# Patient Record
Sex: Female | Born: 1955 | Race: White | Hispanic: No | Marital: Married | Smoking: Never smoker
Health system: Southern US, Community
[De-identification: ages and names within clinical notes are randomized; demographics above are authoritative.]

## PROBLEM LIST (undated history)

## (undated) ENCOUNTER — Ambulatory Visit: Discharge status: Absent without leave | Payer: Federal, State, Local not specified - PPO

## (undated) DIAGNOSIS — A389 Scarlet fever, uncomplicated: Secondary | ICD-10-CM

## (undated) DIAGNOSIS — I10 Essential (primary) hypertension: Secondary | ICD-10-CM

## (undated) DIAGNOSIS — E7800 Pure hypercholesterolemia, unspecified: Secondary | ICD-10-CM

## (undated) DIAGNOSIS — E119 Type 2 diabetes mellitus without complications: Secondary | ICD-10-CM

## (undated) DIAGNOSIS — Z8619 Personal history of other infectious and parasitic diseases: Secondary | ICD-10-CM

## (undated) HISTORY — DX: Pure hypercholesterolemia, unspecified: E78.00

## (undated) HISTORY — PX: EYE SURGERY: SHX253

## (undated) HISTORY — PX: KNEE ARTHROSCOPY: SUR90

## (undated) HISTORY — PX: SHOULDER SURGERY: SHX246

## (undated) HISTORY — PX: ELBOW BURSA SURGERY: SHX615

## (undated) HISTORY — PX: TONSILLECTOMY: SUR1361

## (undated) HISTORY — PX: BUNIONECTOMY: SHX129

## (undated) HISTORY — PX: CERVICAL DISC SURGERY: SHX588

---

## 1998-08-20 HISTORY — PX: ABDOMINAL HYSTERECTOMY: SHX81

## 1998-10-13 ENCOUNTER — Encounter: Payer: Self-pay | Admitting: Neurosurgery

## 1998-10-13 ENCOUNTER — Inpatient Hospital Stay (HOSPITAL_COMMUNITY): Admission: RE | Admit: 1998-10-13 | Discharge: 1998-10-14 | Payer: Self-pay | Admitting: Neurosurgery

## 1998-10-14 ENCOUNTER — Encounter: Payer: Self-pay | Admitting: Neurosurgery

## 2005-12-07 ENCOUNTER — Ambulatory Visit: Payer: Self-pay | Admitting: *Deleted

## 2006-10-07 ENCOUNTER — Ambulatory Visit: Payer: Self-pay

## 2006-12-20 ENCOUNTER — Ambulatory Visit: Payer: Self-pay | Admitting: Gastroenterology

## 2007-01-02 ENCOUNTER — Ambulatory Visit: Payer: Self-pay

## 2008-01-15 ENCOUNTER — Ambulatory Visit: Payer: Self-pay

## 2009-01-25 ENCOUNTER — Ambulatory Visit: Payer: Self-pay | Admitting: Family Medicine

## 2009-04-24 ENCOUNTER — Ambulatory Visit: Payer: Self-pay | Admitting: Internal Medicine

## 2014-02-03 ENCOUNTER — Ambulatory Visit: Payer: Self-pay | Admitting: Specialist

## 2014-04-05 ENCOUNTER — Ambulatory Visit: Payer: Self-pay | Admitting: Anesthesiology

## 2014-04-13 ENCOUNTER — Ambulatory Visit: Payer: Self-pay | Admitting: Orthopedic Surgery

## 2014-08-20 HISTORY — PX: CATARACT EXTRACTION: SUR2

## 2014-12-11 NOTE — Op Note (Signed)
PATIENT NAME:  Pamela Baldwin, Pamela Baldwin MR#:  [id number] DATE OF BIRTH:  10-16-1955  DATE OF PROCEDURE:  04/13/2014  PREOPERATIVE DIAGNOSES: Left shoulder rotator cuff tear, subacromial impingement, acromioclavicular joint arthrosis and a possible biceps tendinosis versus tear.   POSTOPERATIVE DIAGNOSES: Left shoulder enlarged rotator cuff tear involving the supraspinatus and infraspinatus retracted to the glenoid, superior labral anterior posterior tear (SLAP tear) with biceps tendinosis, acromioclavicular joint arthrosis  and subacromial impingement.   PROCEDURE: Left shoulder arthroscopic subacromial decompression and distal clavicle excision with mini open biceps tenodesis and rotator cuff repair.   SURGEON: Thornton Park, MD   ANESTHESIA: General.   ESTIMATED BLOOD LOSS: Minimal.   COMPLICATIONS: None.   IMPLANTS: ArthroCare Magnum 2 anchors x 4.   INDICATIONS FOR PROCEDURE: The patient is a 59 year old female who has had persistent left shoulder pain which has not responded to nonsteroidal anti-inflammatories, corticosteroid injection and therapy. The patient has had a sharp increase in pain beginning in July. Her initial MRI showed a tear involving the supraspinatus with slight retraction. Given the fact that she has not responded to nonoperative management, the patient wished to proceed with surgical fixation. I had reviewed the risks and benefits of surgery with her in my office prior to the day of surgery. She understood the risks of surgery included infection, bleeding, nerve or blood vessel injury which may lead to permanent left upper extremity weakness or numbness, persistent pain, re- tear of the rotator cuff, hardware failure and the need for further surgery. Medical risks include, but are not limited to DVT and pulmonary embolism, myocardial infarction, stroke, pneumonia, respiratory failure and death. The patient understood these risks and wished to proceed.   PROCEDURE NOTE: The  patient was met in the preoperative area. Her daughter was by the bedside. I answered all of their questions and explained what to expect in their immediate postoperative course. I updated the patient's history and physical and signed the left shoulder according to the hospital's right site protocol. The patient was then brought to the Operating Room. She underwent general anesthesia with an LMA. She was then positioned in a beach chair position. She had previously received an interscalene block in the preoperative holding area by the anesthesia service as well. All bony prominences were adequately padded. The patient was prepped and draped in a sterile fashion. A timeout was performed to verify the patient's name, date of birth, medical record number, correct site of surgery and correct procedure to be performed. It was also used to verify the patient had received antibiotics, and all proper instruments, implants and radiographic studies were available in the room. Once all in attendance were in agreement, the case began.   A spider arm positioner was used for this case. The patient had all bony landmarks drawn out with a surgical marker along with proposed arthroscopy incisions. These were pre-injected with 1% lidocaine plain. An 11 blade was used to establish a posterior portal through which the arthroscope was placed into the glenohumeral joint. An 18-gauge spinal needle was then used to provide localization for the anterior portal. This was also created with a #11 blade. A 5.75 mm arthroscopic cannula was placed through the anterior portal. The findings on arthroscopy included a large retracted tear involving the supraspinatus and infraspinatus to the level of the glenohumeral joint. The patient had a large SLAP tear with biceps tenodesis. The patient had mild fraying of glenoid and humeral head surfaces. There were no chondral loose bodies or HAGL lesions in  the inferior recess. The supraspinatus was intact.  The posterior and anterior-inferior labrum were also intact and without tear. An 18-gauge spinal needle was used to create a lateral portal as well. Through this, an Therapist, nutritional was placed into the rotator cuff as a traction suture. The decision was made, given the large SLAP tear and the biceps tendinosis, to perform a biceps tenodesis. Through the lateral portal, a Smart stitch was also placed into the biceps tendon. Then, a 90 degree ArthroCare wand was used to perform a biceps tenotomy arthroscopically. The  4-0 resector shaver blade was then used to debride the superior labrum. The arthroscope was then placed into the subacromial space. Through the lateral portal, a 5.5 mm resector shaver blade was used to perform a subacromial decompression. It was then placed through the anterior portal and a distal clavicle excision was also performed. Two additional Smart stitches were placed in the lateral edge of the rotator cuff. All bony debris was removed from the subacromial space. An arthroscopic elevator was used to mobilize the rotator cuff tear with the assistance of the Smart stitches as traction sutures. Final arthroscopic images were taken. All arthroscopic instruments were then removed. A saber-type incision was made along the lateral border of the acromion approximately 2 to 2.5 cm in length. The subcutaneous tissue was dissected with the Metzenbaum scissor and pick-up. The deltoid fascia was then identified and split and the deltoid muscle split in line with its fibers. The Smart stitches were removed through the deltoid split incision. A shoulder self-retaining retractor was placed and the deltoid split to allow for visualization of the rotator cuff. The attention was turned to performing the biceps tenodesis. A single ArthroCare Magnum anchor was drilled and then placed at the superior aspect of the intertuberous groove. The Smart stitch that was earlier placed in the biceps was then placed  into the Magnum 2 anchor and it was tensioned to allow for approximation of the biceps to the Magnum 2 anchor at the top of the intertuberous groove. The intra-articular portion of the biceps had been resected using Metzenbaum scissor. Once the biceps tenodesis was completed, the attention was turned to the rotator cuff repair. The rotator cuff could be reduced to the greater tuberosity. A 5.5 mm resector shaver blade was used to gently burr the surface of the greater tuberosity to remove all remaining torn fibers of the rotator cuff. Punctate bleeding was identified which will assist healing of the rotator cuff. Three Magnum 2 anchors were then placed along the lateral border of the greater tuberosity. Each Smart stitch was placed in a separate anchor and tensioned to allow for anatomic reduction of the rotator cuff onto the greater tuberosity. Once all 3 anchors were placed, the shoulder was taken through a gentle range of motion. There were no gaps or dog ears in the repair. The rotator cuff was well approximated to the greater tuberosity. External images of the repair were taken through the deltoid split incision with the arthroscope and then the arthroscope was placed back through the posterior portal and internal photos of the repair were taken. The joint was copiously irrigated. All arthroscopic instruments were removed. The self-retaining shoulder retractor was also removed. The deltoid fascia was closed with interrupted 0 Vicryl, subcutaneous tissue closed with 2-0 Vicryl and the saber incision was closed with a running 4-0 undyed Monocryl. Three arthroscopy portal incisions were closed with 4-0 Monocryl after a single 2-0 Vicryl suture was placed in the subcutaneous tissue.  A dry sterile dressing was applied along with TENS unit leads, a Polar Care sleeve and an abduction sling. The patient was awakened and transferred to a hospital stretcher where she was brought to the PACU in stable condition. I was  scrubbed and present for the entire case, and all sharp and instrument counts were correct at the conclusion of the case. I spoke with the patient's daughter in the postoperative consultation room to let her know the case had gone without complication and the patient was stable in the recovery room.    ____________________________ Timoteo Gaul, MD klk:TT D: 04/19/2014 20:12:05 ET T: 04/19/2014 21:49:00 ET JOB#: [id number]  cc: Timoteo Gaul, MD, <Dictator> Timoteo Gaul MD ELECTRONICALLY SIGNED 04/25/2014 13:49

## 2015-06-21 ENCOUNTER — Other Ambulatory Visit: Payer: Self-pay | Admitting: Otolaryngology

## 2015-06-21 DIAGNOSIS — R42 Dizziness and giddiness: Secondary | ICD-10-CM

## 2015-06-21 DIAGNOSIS — E041 Nontoxic single thyroid nodule: Secondary | ICD-10-CM

## 2015-06-21 DIAGNOSIS — H903 Sensorineural hearing loss, bilateral: Secondary | ICD-10-CM

## 2015-06-21 DIAGNOSIS — H905 Unspecified sensorineural hearing loss: Secondary | ICD-10-CM

## 2015-07-06 ENCOUNTER — Ambulatory Visit
Admission: RE | Admit: 2015-07-06 | Discharge: 2015-07-06 | Disposition: A | Discharge status: Home / Self Care | Payer: Federal, State, Local not specified - PPO | Source: Ambulatory Visit | Attending: Otolaryngology | Admitting: Otolaryngology

## 2015-07-06 ENCOUNTER — Ambulatory Visit: Admission: RE | Admit: 2015-07-06 | Payer: Federal, State, Local not specified - PPO | Source: Ambulatory Visit

## 2015-07-06 DIAGNOSIS — R42 Dizziness and giddiness: Secondary | ICD-10-CM | POA: Diagnosis present

## 2015-07-06 DIAGNOSIS — H903 Sensorineural hearing loss, bilateral: Secondary | ICD-10-CM

## 2015-07-06 DIAGNOSIS — H905 Unspecified sensorineural hearing loss: Secondary | ICD-10-CM

## 2015-07-06 DIAGNOSIS — E041 Nontoxic single thyroid nodule: Secondary | ICD-10-CM | POA: Insufficient documentation

## 2015-07-06 MED ORDER — GADOBENATE DIMEGLUMINE 529 MG/ML IV SOLN
18.0000 mL | Freq: Once | INTRAVENOUS | Status: AC | PRN
  Administered 2015-07-06: 18 mL via INTRAVENOUS

## 2015-08-02 ENCOUNTER — Other Ambulatory Visit: Payer: Self-pay | Admitting: Otolaryngology

## 2015-08-02 DIAGNOSIS — E041 Nontoxic single thyroid nodule: Secondary | ICD-10-CM

## 2016-04-16 ENCOUNTER — Ambulatory Visit (INDEPENDENT_AMBULATORY_CARE_PROVIDER_SITE_OTHER): Payer: Federal, State, Local not specified - PPO | Admitting: Podiatry

## 2016-04-16 ENCOUNTER — Ambulatory Visit (INDEPENDENT_AMBULATORY_CARE_PROVIDER_SITE_OTHER): Payer: Federal, State, Local not specified - PPO

## 2016-04-16 ENCOUNTER — Encounter: Payer: Self-pay | Admitting: Podiatry

## 2016-04-16 DIAGNOSIS — M79671 Pain in right foot: Secondary | ICD-10-CM | POA: Diagnosis not present

## 2016-04-16 DIAGNOSIS — M6740 Ganglion, unspecified site: Secondary | ICD-10-CM | POA: Diagnosis not present

## 2016-04-16 DIAGNOSIS — A389 Scarlet fever, uncomplicated: Secondary | ICD-10-CM | POA: Insufficient documentation

## 2016-04-16 DIAGNOSIS — Z8619 Personal history of other infectious and parasitic diseases: Secondary | ICD-10-CM | POA: Insufficient documentation

## 2016-04-16 DIAGNOSIS — I10 Essential (primary) hypertension: Secondary | ICD-10-CM | POA: Insufficient documentation

## 2016-04-16 MED ORDER — DICLOFENAC SODIUM 1 % TD GEL
4.0000 g | Freq: Four times a day (QID) | TRANSDERMAL | 2 refills | Status: DC
Start: 2016-04-16 — End: 2017-03-12

## 2016-04-16 NOTE — Progress Notes (Signed)
   Subjective:    Patient ID: Pamela Baldwin, female    DOB: [birth year], 60 y.o.   MRN: [id number]  HPI: She presents today with chief complaint of a soft nodule to the dorsal aspect of the right foot. She states this been there for approximately 6 weeks and she says this seems to be swelling but there is no pain associated with it and no pressure and thus she has not treated it. She denies any trauma to the foot.  Review of Systems  Musculoskeletal: Positive for arthralgias.  Skin: Positive for rash.  All other systems reviewed and are negative.      Objective:   Physical Exam: Vital signs are stable she is alert and oriented 3 pulses are palpable. Neurologic sensorium is intact deep tendon reflexes are intact muscle strength is 5 over 5 dorsiflexion plantar flexors and inverters everters on physical musculatures intact. Orthopedic evaluation was resolved with distal ankle for range of motion crepitation. She does have a nonpulsatile mass dorsal aspect of the right foot which appears to be bony in nature overlying the first metatarsal medial cuneiform. This appears to be osteoarthritic changes on radiograph reviews of the right foot taken today. She also has a soft tissue mass to the dorsal aspect of the same foot overlying this area consistent with a ganglion cyst. This is nonpulsatile in nature does appear to be fluctuant much like a cyst.      Assessment & Plan:  Diabetes mellitus with ganglion cyst and osteoarthritis dorsal aspect right foot.  Plan: Started her on diclofenac gel discussed the possibility of aspiration which she declined. We also discussed the possible need for surgical intervention she declined. Follow up with her as needed.

## 2016-06-20 ENCOUNTER — Ambulatory Visit
Admission: RE | Admit: 2016-06-20 | Discharge: 2016-06-20 | Disposition: A | Discharge status: Home / Self Care | Payer: Federal, State, Local not specified - PPO | Source: Ambulatory Visit | Attending: Otolaryngology | Admitting: Otolaryngology

## 2016-06-20 DIAGNOSIS — E041 Nontoxic single thyroid nodule: Secondary | ICD-10-CM | POA: Insufficient documentation

## 2017-03-12 ENCOUNTER — Other Ambulatory Visit: Payer: Self-pay | Admitting: Family Medicine

## 2017-03-12 ENCOUNTER — Encounter: Payer: Self-pay | Admitting: Obstetrics and Gynecology

## 2017-03-12 ENCOUNTER — Ambulatory Visit (INDEPENDENT_AMBULATORY_CARE_PROVIDER_SITE_OTHER): Payer: Federal, State, Local not specified - PPO | Admitting: Obstetrics and Gynecology

## 2017-03-12 ENCOUNTER — Other Ambulatory Visit: Payer: Self-pay | Admitting: Obstetrics and Gynecology

## 2017-03-12 DIAGNOSIS — Z1231 Encounter for screening mammogram for malignant neoplasm of breast: Secondary | ICD-10-CM

## 2017-03-12 DIAGNOSIS — Z01419 Encounter for gynecological examination (general) (routine) without abnormal findings: Secondary | ICD-10-CM

## 2017-03-12 DIAGNOSIS — Z1239 Encounter for other screening for malignant neoplasm of breast: Secondary | ICD-10-CM

## 2017-03-12 NOTE — Progress Notes (Signed)
Patient ID: Pamela Baldwin, female   DOB: September 18, 1955, 61 y.o.   MRN: [id number]     Gynecology Annual Exam  PCP: [facility]  Chief Complaint:  Chief Complaint  Patient presents with  . Gynecologic Exam    History of Present Illness:Patient is a 61 y.o. [id number] presents for annual exam. The patient has no complaints today.   LMP: No LMP recorded. Patient has had a hysterectomy.   The patient is sexually active. She denies dyspareunia.  The patient does perform self breast exams.  There is no notable family history of breast or ovarian cancer in her family.  The patient wears seatbelts: yes.   The patient has regular exercise: not asked.    The patient denies current symptoms of depression.     Review of Systems: Review of Systems  Constitutional: Negative for chills and fever.  HENT: Negative for congestion.   Respiratory: Negative for cough and shortness of breath.   Cardiovascular: Negative for chest pain and palpitations.  Gastrointestinal: Negative for abdominal pain, constipation, diarrhea, heartburn, nausea and vomiting.  Genitourinary: Negative for dysuria, frequency and urgency.  Skin: Negative for itching and rash.  Neurological: Negative for dizziness and headaches.  Endo/Heme/Allergies: Negative for polydipsia.  Psychiatric/Behavioral: Negative for depression.    Past Medical History:  History reviewed. No pertinent past medical history.  Past Surgical History:  Past Surgical History:  Procedure Laterality Date  . ABDOMINAL HYSTERECTOMY  2000  . CATARACT EXTRACTION  2016  . SHOULDER SURGERY      Gynecologic History:  No LMP recorded. Patient has had a hysterectomy.  Obstetric History: [id number]  Family History:  Family History  Problem Relation Age of Onset  . Colon cancer Brother 51  . Liver cancer Maternal Uncle 5    Social History:  Social History   Social History  . Marital status: Married    Spouse name: N/A  . Number  of children: N/A  . Years of education: N/A   Occupational History  . Not on file.   Social History Main Topics  . Smoking status: Never Smoker  . Smokeless tobacco: Never Used  . Alcohol use Yes  . Drug use: No  . Sexual activity: Yes    Birth control/ protection: None   Other Topics Concern  . Not on file   Social History Narrative  . No narrative on file    Allergies:  Allergies  Allergen Reactions  . Morphine And Related   . Penicillins     Medications: Prior to Admission medications   Medication Sig Start Date End Date Taking? Authorizing Provider  atorvastatin (LIPITOR) 40 MG tablet  03/07/17  Yes [provider]  baclofen (LIORESAL) 10 MG tablet  12/05/16  Yes [provider]  diclofenac (VOLTAREN) 75 MG EC tablet  12/05/16  Yes [provider]  lisinopril (PRINIVIL,ZESTRIL) 10 MG tablet Take 10 mg by mouth daily.   Yes [provider]  metFORMIN (GLUCOPHAGE) 500 MG tablet Take by mouth 2 (two) times daily with a meal.   Yes [provider]    Physical Exam Vitals: Blood pressure 116/64, pulse 93, height 5\' 4"  (1.626 m), weight 167 lb (75.8 kg).  General: NAD HEENT: normocephalic, anicteric Thyroid: no enlargement, no palpable nodules Pulmonary: No increased work of breathing, CTAB Cardiovascular: RRR, distal pulses 2+ Breast: Breast symmetrical, no tenderness, no palpable nodules or masses, no skin or nipple retraction present, no nipple discharge.  No axillary or supraclavicular lymphadenopathy.  Abdomen: NABS, soft, non-tender, non-distended.  Umbilicus without lesions.  No hepatomegaly, splenomegaly or masses palpable. No evidence of hernia  Genitourinary:  External: Normal external female genitalia.  Normal urethral meatus, normal  Bartholin's and Skene's glands.    Vagina: Normal vaginal mucosa, no evidence of prolapse.    Cervix: surgically absent  Uterus: surgically absent  Adnexa: ovaries non-enlarged, no  adnexal masses  Rectal: deferred  Lymphatic: no evidence of inguinal lymphadenopathy Extremities: no edema, erythema, or tenderness Neurologic: Grossly intact Psychiatric: mood appropriate, affect full  Female chaperone present for pelvic and breast  portions of the physical exam    Assessment: 61 y.o. [id number] annual exam Plan: Problem List Items Addressed This Visit    None      1) Mammogram - recommend yearly screening mammogram.  Mammogram Was ordered today  2) STI screening was not offered  3) ASCCP guidelines and rational discussed.  Patient opts for discontinue screening  4) Osteoporosis  - per USPTF routine screening DEXA at age 75 - FRAX 58 year major fracture risk 7.5%,  10 year hip fracture risk 0.7%  5) Routine healthcare maintenance including cholesterol, diabetes screening discussed managed by PCP  6) Colonoscopy .  Screening recommended starting at age 35 for average risk individuals, age 87 for individuals deemed at increased risk (including African Americans) and recommended to continue until age 33.  For patient age 34-85 individualized approach is recommended.  Gold standard screening is via colonoscopy, Cologuard screening is an acceptable alternative for patient unwilling or unable to undergo colonoscopy.  "Colorectal cancer screening for average?risk adults: 2018 guideline update from the American Cancer Society"CA: A Cancer Journal for Clinicians: Jan 16, 2017  - scheduled for September 2018 (5 year interval)  7) Follow up 1 year for routine annual

## 2017-03-12 NOTE — Patient Instructions (Signed)
Preventive Care 40-64 Years, Female Preventive care refers to lifestyle choices and visits with your health care provider that can promote health and wellness. What does preventive care include?  A yearly physical exam. This is also called an annual well check.  Dental exams once or twice a year.  Routine eye exams. Ask your health care provider how often you should have your eyes checked.  Personal lifestyle choices, including: ? Daily care of your teeth and gums. ? Regular physical activity. ? Eating a healthy diet. ? Avoiding tobacco and drug use. ? Limiting alcohol use. ? Practicing safe sex. ? Taking low-dose aspirin daily starting at age 58. ? Taking vitamin and mineral supplements as recommended by your health care provider. What happens during an annual well check? The services and screenings done by your health care provider during your annual well check will depend on your age, overall health, lifestyle risk factors, and family history of disease. Counseling Your health care provider may ask you questions about your:  Alcohol use.  Tobacco use.  Drug use.  Emotional well-being.  Home and relationship well-being.  Sexual activity.  Eating habits.  Work and work Statistician.  Method of birth control.  Menstrual cycle.  Pregnancy history.  Screening You may have the following tests or measurements:  Height, weight, and BMI.  Blood pressure.  Lipid and cholesterol levels. These may be checked every 5 years, or more frequently if you are over 81 years old.  Skin check.  Lung cancer screening. You may have this screening every year starting at age 78 if you have a 30-pack-year history of smoking and currently smoke or have quit within the past 15 years.  Fecal occult blood test (FOBT) of the stool. You may have this test every year starting at age 65.  Flexible sigmoidoscopy or colonoscopy. You may have a sigmoidoscopy every 5 years or a colonoscopy  every 10 years starting at age 30.  Hepatitis C blood test.  Hepatitis B blood test.  Sexually transmitted disease (STD) testing.  Diabetes screening. This is done by checking your blood sugar (glucose) after you have not eaten for a while (fasting). You may have this done every 1-3 years.  Mammogram. This may be done every 1-2 years. Talk to your health care provider about when you should start having regular mammograms. This may depend on whether you have a family history of breast cancer.  BRCA-related cancer screening. This may be done if you have a family history of breast, ovarian, tubal, or peritoneal cancers.  Pelvic exam and Pap test. This may be done every 3 years starting at age 80. Starting at age 36, this may be done every 5 years if you have a Pap test in combination with an HPV test.  Bone density scan. This is done to screen for osteoporosis. You may have this scan if you are at high risk for osteoporosis.  Discuss your test results, treatment options, and if necessary, the need for more tests with your health care provider. Vaccines Your health care provider may recommend certain vaccines, such as:  Influenza vaccine. This is recommended every year.  Tetanus, diphtheria, and acellular pertussis (Tdap, Td) vaccine. You may need a Td booster every 10 years.  Varicella vaccine. You may need this if you have not been vaccinated.  Zoster vaccine. You may need this after age 5.  Measles, mumps, and rubella (MMR) vaccine. You may need at least one dose of MMR if you were born in  1957 or later. You may also need a second dose.  Pneumococcal 13-valent conjugate (PCV13) vaccine. You may need this if you have certain conditions and were not previously vaccinated.  Pneumococcal polysaccharide (PPSV23) vaccine. You may need one or two doses if you smoke cigarettes or if you have certain conditions.  Meningococcal vaccine. You may need this if you have certain  conditions.  Hepatitis A vaccine. You may need this if you have certain conditions or if you travel or work in places where you may be exposed to hepatitis A.  Hepatitis B vaccine. You may need this if you have certain conditions or if you travel or work in places where you may be exposed to hepatitis B.  Haemophilus influenzae type b (Hib) vaccine. You may need this if you have certain conditions.  Talk to your health care provider about which screenings and vaccines you need and how often you need them. This information is not intended to replace advice given to you by your health care provider. Make sure you discuss any questions you have with your health care provider. Document Released: 09/02/2015 Document Revised: 04/25/2016 Document Reviewed: 06/07/2015 Elsevier Interactive Patient Education  2017 [employer].

## 2017-03-19 ENCOUNTER — Ambulatory Visit
Admission: RE | Admit: 2017-03-19 | Discharge: 2017-03-19 | Disposition: A | Discharge status: Home / Self Care | Payer: Federal, State, Local not specified - PPO | Source: Ambulatory Visit | Attending: Obstetrics and Gynecology | Admitting: Obstetrics and Gynecology

## 2017-03-19 DIAGNOSIS — Z1231 Encounter for screening mammogram for malignant neoplasm of breast: Secondary | ICD-10-CM | POA: Insufficient documentation

## 2017-03-28 ENCOUNTER — Inpatient Hospital Stay
Admission: RE | Admit: 2017-03-28 | Discharge: 2017-03-28 | Disposition: A | Discharge status: Home / Self Care | Payer: Self-pay | Source: Ambulatory Visit | Attending: *Deleted | Admitting: *Deleted

## 2017-03-28 ENCOUNTER — Encounter: Payer: Self-pay | Admitting: Obstetrics and Gynecology

## 2017-03-28 ENCOUNTER — Other Ambulatory Visit: Payer: Self-pay | Admitting: *Deleted

## 2017-03-28 DIAGNOSIS — Z9289 Personal history of other medical treatment: Secondary | ICD-10-CM

## 2018-05-26 ENCOUNTER — Encounter (INDEPENDENT_AMBULATORY_CARE_PROVIDER_SITE_OTHER): Payer: Federal, State, Local not specified - PPO | Admitting: Vascular Surgery

## 2018-06-23 ENCOUNTER — Encounter (INDEPENDENT_AMBULATORY_CARE_PROVIDER_SITE_OTHER): Payer: Self-pay | Admitting: Vascular Surgery

## 2018-06-23 ENCOUNTER — Ambulatory Visit (INDEPENDENT_AMBULATORY_CARE_PROVIDER_SITE_OTHER): Payer: Federal, State, Local not specified - PPO | Admitting: Vascular Surgery

## 2018-06-23 VITALS — BP 120/72 | HR 96 | Resp 16 | Ht 64.0 in | Wt 167.0 lb

## 2018-06-23 DIAGNOSIS — E1169 Type 2 diabetes mellitus with other specified complication: Secondary | ICD-10-CM | POA: Diagnosis not present

## 2018-06-23 DIAGNOSIS — R600 Localized edema: Secondary | ICD-10-CM

## 2018-06-23 DIAGNOSIS — E785 Hyperlipidemia, unspecified: Secondary | ICD-10-CM

## 2018-06-23 DIAGNOSIS — I83813 Varicose veins of bilateral lower extremities with pain: Secondary | ICD-10-CM | POA: Diagnosis not present

## 2018-06-23 DIAGNOSIS — E119 Type 2 diabetes mellitus without complications: Secondary | ICD-10-CM | POA: Insufficient documentation

## 2018-06-23 DIAGNOSIS — I159 Secondary hypertension, unspecified: Secondary | ICD-10-CM

## 2018-06-23 NOTE — Progress Notes (Addendum)
Subjective:    Patient ID: Pamela Baldwin, female    DOB: [birth year], 62 y.o.   MRN: [id number] Chief Complaint  Patient presents with  . New Patient (Initial Visit)    Varicose Veins   Presents as a new patient self-referred for evaluation of painful varicose veins.  Patient endorses long-standing history of varicosities located bilateral legs.  Over the last few years, the patient notes an increase in number, size and discomfort to these varicosities.  The patient also experiences edema to the bilateral legs.  This edema is also associated with discomfort especially towards the end of the day.  The patient notes that the discomfort along her varicosities and associated edema worsens with standing and sitting for long periods of time. The patient feels that her abdomen to have progressed to the point that she is unable to function on daily basis they become lifestyle limiting.  At this time, the patient denied engaging conservative therapy including wearing medical grade 1 compression socks, elevating her legs and remaining active on a daily basis.  She denies any recent surgery or trauma, DVT or recurrent bouts of cellulitis to the bilateral legs.  Patient denies any claudication-like symptoms, rest pain or ulcer formation to the bilateral legs.  The patient denies any fever, nausea vomiting.  Review of Systems  Constitutional: Negative.   HENT: Negative.   Eyes: Negative.   Respiratory: Negative.   Cardiovascular: Positive for leg swelling.       Painful varicose veins  Gastrointestinal: Negative.   Endocrine: Negative.   Genitourinary: Negative.   Musculoskeletal: Negative.   Skin: Negative.   Allergic/Immunologic: Negative.   Neurological: Negative.   Hematological: Negative.   Psychiatric/Behavioral: Negative.       Objective:   Physical Exam  Constitutional: She is oriented to person, place, and time. She appears well-developed and well-nourished. No distress.  HENT:  Head:  Normocephalic and atraumatic.  Right Ear: External ear normal.  Left Ear: External ear normal.  Eyes: Pupils are equal, round, and reactive to light. Conjunctivae and EOM are normal.  Neck: Normal range of motion.  Cardiovascular: Normal rate, regular rhythm, normal heart sounds and intact distal pulses.  Pulses:      Radial pulses are 2+ on the right side, and 2+ on the left side.       Dorsalis pedis pulses are 2+ on the right side, and 2+ on the left side.       Posterior tibial pulses are 2+ on the right side, and 2+ on the left side.  Pulmonary/Chest: Effort normal and breath sounds normal.  Musculoskeletal: Normal range of motion. She exhibits edema (Mild non-pitting edema bilaterally).  Neurological: She is alert and oriented to person, place, and time.  Skin: Skin is warm and dry. She is not diaphoretic.  She presents with a mixture of greater than 1 cm less than 1 cm varicose veins the bilateral legs.  There is no stasis dermatitis, fibrosis, cellulitis or active ulcerations noted to the bilateral legs  Psychiatric: She has a normal mood and affect. Her behavior is normal. Judgment and thought content normal.  Vitals reviewed.  BP 120/72 (BP Location: Right Arm, Patient Position: Sitting)   Pulse 96   Resp 16   Ht 5\' 4"  (1.626 m)   Wt 167 lb (75.8 kg)   BMI 28.67 kg/m   History reviewed. No pertinent past medical history.  Social History   Socioeconomic History  . Marital status: Married  Spouse name: Not on file  . Number of children: Not on file  . Years of education: Not on file  . Highest education level: Not on file  Occupational History  . Not on file  Social Needs  . Financial resource strain: Not on file  . Food insecurity:    Worry: Not on file    Inability: Not on file  . Transportation needs:    Medical: Not on file    Non-medical: Not on file  Tobacco Use  . Smoking status: Never Smoker  . Smokeless tobacco: Never Used  Substance and Sexual  Activity  . Alcohol use: Yes  . Drug use: No  . Sexual activity: Yes    Birth control/protection: None  Lifestyle  . Physical activity:    Days per week: Not on file    Minutes per session: Not on file  . Stress: Not on file  Relationships  . Social connections:    Talks on phone: Not on file    Gets together: Not on file    Attends religious service: Not on file    Active member of club or organization: Not on file    Attends meetings of clubs or organizations: Not on file    Relationship status: Not on file  . Intimate partner violence:    Fear of current or ex partner: Not on file    Emotionally abused: Not on file    Physically abused: Not on file    Forced sexual activity: Not on file  Other Topics Concern  . Not on file  Social History Narrative  . Not on file   Past Surgical History:  Procedure Laterality Date  . ABDOMINAL HYSTERECTOMY  2000  . CATARACT EXTRACTION  2016  . SHOULDER SURGERY     Family History  Problem Relation Age of Onset  . Colon cancer Brother 2  . Liver cancer Maternal Uncle 68  . Breast cancer Neg Hx    Allergies  Allergen Reactions  . Morphine And Related   . Penicillins       Assessment & Plan:  Presents as a new patient self-referred for evaluation of painful varicose veins.  Patient endorses long-standing history of varicosities located bilateral legs.  Over the last few years, the patient notes an increase in number, size and discomfort to these varicosities.  The patient also experiences edema to the bilateral legs.  This edema is also associated with discomfort especially towards the end of the day.  The patient notes that the discomfort along her varicosities and associated edema worsens with standing and sitting for long periods of time. The patient feels that her abdomen to have progressed to the point that she is unable to function on daily basis they become lifestyle limiting.  At this time, the patient denied engaging  conservative therapy including wearing medical grade 1 compression socks, elevating her legs and remaining active on a daily basis.  She denies any recent surgery or trauma, DVT or recurrent bouts of cellulitis to the bilateral legs.  Patient denies any claudication-like symptoms, rest pain or ulcer formation to the bilateral legs.  The patient denies any fever, nausea vomiting.  1. Varicose veins of both lower extremities with pain - New The patient was encouraged to wear graduated compression stockings (20-30 mmHg) on a daily basis. The patient was instructed to begin wearing the stockings first thing in the morning and removing them in the evening. The patient was instructed specifically not to sleep  in the stockings. Prescription given. In addition, behavioral modification including elevation during the day will be initiated. Anti-inflammatories for pain. I will bring her patient back and have him undergo a bilateral lower extremity venous duplex to rule out any contributing venous versus lymphatic disease The patient will follow up in three months to asses conservative management.  Information on compression stockings was given to the patient. The patient was instructed to call the office in the interim if any worsening edema or ulcerations to the legs, feet or toes occurs. The patient expresses their understanding.  - VAS [place] LOWER EXTREMITY VENOUS REFLUX; Future  2. Bilateral lower extremity edema - New As above  - VAS [place] LOWER EXTREMITY VENOUS REFLUX; Future  3. Hyperlipidemia, unspecified hyperlipidemia type - Stable Encouraged good control as its slows the progression of atherosclerotic disease  4. Type 2 diabetes mellitus with other specified complication, unspecified whether long term insulin use (HCC) - Stable Encouraged good control as its slows the progression of atherosclerotic disease  5. Secondary hypertension - Stable Encouraged good control as its slows the progression of  atherosclerotic disease  Current Outpatient Medications on File Prior to Visit  Medication Sig Dispense Refill  . atorvastatin (LIPITOR) 40 MG tablet     . lisinopril (PRINIVIL,ZESTRIL) 10 MG tablet Take 10 mg by mouth daily.    . metFORMIN (GLUCOPHAGE) 500 MG tablet Take by mouth 2 (two) times daily with a meal.    . baclofen (LIORESAL) 10 MG tablet     . cetirizine (ZYRTEC) 10 MG tablet Take by mouth.    . diazepam (VALIUM) 5 MG tablet Take by mouth.    . diclofenac (VOLTAREN) 75 MG EC tablet     . triamcinolone cream (KENALOG) 0.1 % Apply topically.     No current facility-administered medications on file prior to visit.    There are no Patient Instructions on file for this visit. No follow-ups on file.  Donathan Buller A Jaterrius Ricketson, PA-C

## 2018-08-07 ENCOUNTER — Ambulatory Visit: Payer: Federal, State, Local not specified - PPO | Admitting: Obstetrics and Gynecology

## 2018-08-08 ENCOUNTER — Encounter: Payer: Self-pay | Admitting: Obstetrics and Gynecology

## 2018-08-08 ENCOUNTER — Ambulatory Visit (INDEPENDENT_AMBULATORY_CARE_PROVIDER_SITE_OTHER): Payer: Federal, State, Local not specified - PPO | Admitting: Obstetrics and Gynecology

## 2018-08-08 VITALS — BP 134/68 | HR 90 | Ht 64.0 in | Wt 172.0 lb

## 2018-08-08 DIAGNOSIS — Z01419 Encounter for gynecological examination (general) (routine) without abnormal findings: Secondary | ICD-10-CM | POA: Diagnosis not present

## 2018-08-08 DIAGNOSIS — Z1239 Encounter for other screening for malignant neoplasm of breast: Secondary | ICD-10-CM

## 2018-08-08 NOTE — Patient Instructions (Signed)
[facility] [address] [place] [address]  [facility] [place]  [address] [place]  [address]  Phone: [phone]  

## 2018-08-08 NOTE — Progress Notes (Signed)
Gynecology Annual Exam  PCP: Patient, No Pcp Per  Chief Complaint:  Chief Complaint  Patient presents with  . Gynecologic Exam    History of Present Illness:Patient is a 62 y.o. [id number] presents for annual exam. The patient has no complaints today.   LMP: No LMP recorded. Patient has had a hysterectomy.   The patient does perform self breast exams.  There is no notable family history of breast or ovarian cancer in her family.  The patient wears seatbelts: yes.   The patient has regular exercise: not asked.    The patient denies current symptoms of depression.     Review of Systems: Review of Systems  Constitutional: Negative for chills and fever.  HENT: Negative for congestion.   Respiratory: Negative for cough and shortness of breath.   Cardiovascular: Negative for chest pain and palpitations.  Gastrointestinal: Negative for abdominal pain, constipation, diarrhea, heartburn, nausea and vomiting.  Genitourinary: Negative for dysuria, frequency and urgency.  Musculoskeletal: Positive for joint pain.  Skin: Negative for itching and rash.  Neurological: Negative for dizziness and headaches.  Endo/Heme/Allergies: Negative for polydipsia.  Psychiatric/Behavioral: Negative for depression.    Past Medical History:  Past Medical History:  Diagnosis Date  . Hypercholesteremia     Past Surgical History:  Past Surgical History:  Procedure Laterality Date  . ABDOMINAL HYSTERECTOMY  2000  . CATARACT EXTRACTION  2016  . SHOULDER SURGERY      Gynecologic History:  No LMP recorded. Patient has had a hysterectomy. Last Pap: Results were: N/A prior hysterectomy Last mammogram: 03/19/2017 Results were: Gillian Shields I  Obstetric History: [id number]  Family History:  Family History  Problem Relation Age of Onset  . Colon cancer Brother 70  . Pancreatic cancer Brother 102  . Liver cancer Maternal Uncle 63  . Colon cancer Maternal Uncle   . Breast cancer Neg Hx     Social  History:  Social History   Socioeconomic History  . Marital status: Married    Spouse name: Not on file  . Number of children: Not on file  . Years of education: Not on file  . Highest education level: Not on file  Occupational History  . Not on file  Social Needs  . Financial resource strain: Not on file  . Food insecurity:    Worry: Not on file    Inability: Not on file  . Transportation needs:    Medical: Not on file    Non-medical: Not on file  Tobacco Use  . Smoking status: Never Smoker  . Smokeless tobacco: Never Used  Substance and Sexual Activity  . Alcohol use: Yes  . Drug use: No  . Sexual activity: Yes    Birth control/protection: None  Lifestyle  . Physical activity:    Days per week: Not on file    Minutes per session: Not on file  . Stress: Not on file  Relationships  . Social connections:    Talks on phone: Not on file    Gets together: Not on file    Attends religious service: Not on file    Active member of club or organization: Not on file    Attends meetings of clubs or organizations: Not on file    Relationship status: Not on file  . Intimate partner violence:    Fear of current or ex partner: Not on file    Emotionally abused: Not on file    Physically abused: Not on file  Forced sexual activity: Not on file  Other Topics Concern  . Not on file  Social History Narrative  . Not on file    Allergies:  Allergies  Allergen Reactions  . Morphine And Related   . Penicillins     Medications: Prior to Admission medications   Medication Sig Start Date End Date Taking? Authorizing Provider  atorvastatin (LIPITOR) 40 MG tablet  03/07/17   [provider]  baclofen (LIORESAL) 10 MG tablet  12/05/16   [provider]  cetirizine (ZYRTEC) 10 MG tablet Take by mouth.    [provider]  diazepam (VALIUM) 5 MG tablet Take by mouth.    [provider]  diclofenac (VOLTAREN) 75 MG EC tablet  12/05/16   [provider]  lisinopril (PRINIVIL,ZESTRIL) 10 MG tablet Take 10 mg by mouth daily.    [provider]  metFORMIN (GLUCOPHAGE) 500 MG tablet Take by mouth 2 (two) times daily with a meal.    [provider]  triamcinolone cream (KENALOG) 0.1 % Apply topically.    [provider]    Physical Exam Vitals: There were no vitals taken for this visit.  General: NAD HEENT: normocephalic, anicteric Thyroid: no enlargement, no palpable nodules Pulmonary: No increased work of breathing, CTAB Cardiovascular: RRR, distal pulses 2+ Breast: Breast symmetrical, no tenderness, no palpable nodules or masses, no skin or nipple retraction present, no nipple discharge.  No axillary or supraclavicular lymphadenopathy. Abdomen: NABS, soft, non-tender, non-distended.  Umbilicus without lesions.  No hepatomegaly, splenomegaly or masses palpable. No evidence of hernia  Genitourinary:  External: Normal external female genitalia.  Normal urethral meatus, normal Bartholin's and Skene's glands.    Vagina: Normal vaginal mucosa, no evidence of prolapse.    Cervix: surgically absent  Uterus: surgically absent  Adnexa: ovaries non-enlarged, no adnexal masses  Rectal: deferred  Lymphatic: no evidence of inguinal lymphadenopathy Extremities: no edema, erythema, or tenderness Neurologic: Grossly intact Psychiatric: mood appropriate, affect full  Female chaperone present for pelvic and breast  portions of the physical exam     Assessment: 62 y.o. [id number] routine annual exam  Plan: Problem List Items Addressed This Visit    None    Visit Diagnoses    Encounter for gynecological examination without abnormal finding    -  Primary   Breast screening       Relevant Orders   MM 3D SCREEN BREAST BILATERAL      1) Mammogram - recommend yearly screening mammogram.  Mammogram Was ordered today  2) STI screening  was notoffered and therefore not obtained  3) ASCCP guidelines and  rational discussed.  Patient opts for discontinue secondary to prior hysterectomy  4) Osteoporosis  - per USPTF routine screening DEXA at age 33  5) Routine healthcare maintenance including cholesterol, diabetes screening discussed managed by PCP  6) Colonoscopy - 06/12/2017 [place]  7) Return in about 1 year (around 08/09/2019) for annual.    Malachy Mood, MD [place] P[facility], [facility] 08/08/2018, 8:55 AM

## 2018-09-01 ENCOUNTER — Ambulatory Visit
Admission: RE | Admit: 2018-09-01 | Discharge: 2018-09-01 | Disposition: A | Discharge status: Home / Self Care | Payer: Federal, State, Local not specified - PPO | Source: Ambulatory Visit | Attending: Obstetrics and Gynecology | Admitting: Obstetrics and Gynecology

## 2018-09-01 DIAGNOSIS — Z1239 Encounter for other screening for malignant neoplasm of breast: Secondary | ICD-10-CM | POA: Insufficient documentation

## 2018-09-25 ENCOUNTER — Ambulatory Visit (INDEPENDENT_AMBULATORY_CARE_PROVIDER_SITE_OTHER): Payer: Federal, State, Local not specified - PPO | Admitting: Vascular Surgery

## 2018-09-25 ENCOUNTER — Encounter (INDEPENDENT_AMBULATORY_CARE_PROVIDER_SITE_OTHER): Payer: Federal, State, Local not specified - PPO

## 2018-09-29 ENCOUNTER — Ambulatory Visit (INDEPENDENT_AMBULATORY_CARE_PROVIDER_SITE_OTHER): Payer: Federal, State, Local not specified - PPO

## 2018-09-29 ENCOUNTER — Ambulatory Visit (INDEPENDENT_AMBULATORY_CARE_PROVIDER_SITE_OTHER): Payer: Federal, State, Local not specified - PPO | Admitting: Vascular Surgery

## 2018-09-29 ENCOUNTER — Encounter (INDEPENDENT_AMBULATORY_CARE_PROVIDER_SITE_OTHER): Payer: Self-pay | Admitting: Vascular Surgery

## 2018-09-29 VITALS — BP 148/76 | HR 92 | Resp 14 | Ht 64.0 in | Wt 168.6 lb

## 2018-09-29 DIAGNOSIS — E785 Hyperlipidemia, unspecified: Secondary | ICD-10-CM

## 2018-09-29 DIAGNOSIS — I83813 Varicose veins of bilateral lower extremities with pain: Secondary | ICD-10-CM | POA: Diagnosis not present

## 2018-09-29 DIAGNOSIS — I159 Secondary hypertension, unspecified: Secondary | ICD-10-CM

## 2018-09-29 DIAGNOSIS — R600 Localized edema: Secondary | ICD-10-CM

## 2018-09-29 DIAGNOSIS — E1169 Type 2 diabetes mellitus with other specified complication: Secondary | ICD-10-CM | POA: Diagnosis not present

## 2018-09-29 NOTE — Progress Notes (Signed)
MRN : [id number]  Pamela Baldwin is a 63 y.o. (November 04, 1955) female who presents with chief complaint of No chief complaint on file. Marland Kitchen  History of Present Illness:   The patient returns for followup evaluation 3 months after the initial visit. The patient continues to have pain in the lower extremities with dependency. The pain is lessened with elevation. Graduated compression stockings, Class I (20-30 mmHg), have been worn but the stockings do not eliminate the leg pain. Over-the-counter analgesics do not improve the symptoms. The degree of discomfort continues to interfere with daily activities. The patient notes the pain in the legs is causing problems with daily exercise, at the workplace and even with household activities and maintenance such as standing in the kitchen preparing meals and doing dishes.   Venous ultrasound shows normal deep venous system, no evidence of acute or chronic DVT.  Superficial reflux is present in the bilateral great saphenous veins  No outpatient medications have been marked as taking for the 09/29/18 encounter (Appointment) with Delana Meyer, Dolores Lory, MD.    Past Medical History:  Diagnosis Date  . Hypercholesteremia     Past Surgical History:  Procedure Laterality Date  . ABDOMINAL HYSTERECTOMY  2000  . CATARACT EXTRACTION  2016  . SHOULDER SURGERY      Social History Social History   Tobacco Use  . Smoking status: Never Smoker  . Smokeless tobacco: Never Used  Substance Use Topics  . Alcohol use: Yes  . Drug use: No    Family History Family History  Problem Relation Age of Onset  . Colon cancer Brother 60  . Pancreatic cancer Brother 20  . Liver cancer Maternal Uncle 58  . Colon cancer Maternal Uncle   . Breast cancer Neg Hx     Allergies  Allergen Reactions  . Morphine And Related   . Penicillins      REVIEW OF SYSTEMS (Negative unless checked)  Constitutional: [] Weight loss  [] Fever  [] Chills Cardiac: [] Chest pain   [] Chest  pressure   [] Palpitations   [] Shortness of breath when laying flat   [] Shortness of breath with exertion. Vascular:  [] Pain in legs with walking   [x] Pain in legs with standing  [] History of DVT   [] Phlebitis   [x] Swelling in legs   [x] Varicose veins   [] Non-healing ulcers Pulmonary:   [] Uses home oxygen   [] Productive cough   [] Hemoptysis   [] Wheeze  [] COPD   [] Asthma Neurologic:  [] Dizziness   [] Seizures   [] History of stroke   [] History of TIA  [] Aphasia   [] Vissual changes   [] Weakness or numbness in arm   [] Weakness or numbness in leg Musculoskeletal:   [] Joint swelling   [] Joint pain   [] Low back pain Hematologic:  [] Easy bruising  [] Easy bleeding   [] Hypercoagulable state   [] Anemic Gastrointestinal:  [] Diarrhea   [] Vomiting  [] Gastroesophageal reflux/heartburn   [] Difficulty swallowing. Genitourinary:  [] Chronic kidney disease   [] Difficult urination  [] Frequent urination   [] Blood in urine Skin:  [x] Rashes   [] Ulcers  Psychological:  [] History of anxiety   []  History of major depression.  Physical Examination  There were no vitals filed for this visit. There is no height or weight on file to calculate BMI. Gen: WD/WN, NAD Head: [place]/AT, No temporalis wasting.  Ear/Nose/Throat: Hearing grossly intact, nares w/o erythema or drainage Eyes: PER, EOMI, sclera nonicteric.  Neck: Supple, no large masses.   Pulmonary:  Good air movement, no audible wheezing bilaterally, no use of  accessory muscles.  Cardiac: RRR, no JVD Vascular: Large varicosities present extensively greater than 10 mm left more than right.  Mild venous stasis changes to the legs bilaterally.  2+ soft pitting edema Vessel Right Left  Radial Palpable Palpable  Gastrointestinal: Non-distended. No guarding/no peritoneal signs.  Musculoskeletal: M/S 5/5 throughout.  No deformity or atrophy.  Neurologic: CN 2-12 intact. Symmetrical.  Speech is fluent. Motor exam as listed above. Psychiatric: Judgment intact, Mood & affect  appropriate for pt's clinical situation. Dermatologic: venous rashes no ulcers noted.  No changes consistent with cellulitis. Lymph : No lichenification or skin changes of chronic lymphedema.  CBC No results found for: WBC, HGB, HCT, MCV, PLT  BMET No results found for: NA, K, CL, CO2, GLUCOSE, BUN, CREATININE, CALCIUM, GFRNONAA, GFRAA CrCl cannot be calculated (No successful lab value found.).  COAG No results found for: INR, PROTIME  Radiology Mm 3d Screen Breast Bilateral  Result Date: 09/01/2018 CLINICAL DATA:  Screening. EXAM: DIGITAL SCREENING BILATERAL MAMMOGRAM WITH TOMO AND CAD COMPARISON:  Previous exam(s). ACR Breast Density Category b: There are scattered areas of fibroglandular density. FINDINGS: There are no findings suspicious for malignancy. Images were processed with CAD. IMPRESSION: No mammographic evidence of malignancy. A result letter of this screening mammogram will be mailed directly to the patient. RECOMMENDATION: Screening mammogram in one year. (Code:SM-B-01Y) BI-RADS CATEGORY  1: Negative. Electronically Signed   By: Margarette Canada M.D.   On: 09/01/2018 16:12     Assessment/Plan 1. Varicose veins of both lower extremities with pain Recommend  I have reviewed my previous  discussion with the patient regarding  varicose veins and why they cause symptoms. Patient will continue  wearing graduated compression stockings class 1 on a daily basis, beginning first thing in the morning and removing them in the evening.    In addition, behavioral modification including elevation during the day was again discussed and this will continue.  The patient has utilized over the counter pain medications and has been exercising.  However, at this time conservative therapy has not alleviated the patient's symptoms of leg pain and swelling  Recommend: laser ablation of the right and  left great saphenous veins to eliminate the symptoms of pain and swelling of the lower extremities  caused by the severe superficial venous reflux disease.  Left leg will be treated first.  2. Bilateral lower extremity edema No surgery or intervention at this point in time.    I have had a long discussion with the patient regarding venous insufficiency and why it  causes symptoms. I have discussed with the patient the chronic skin changes that accompany venous insufficiency and the long term sequela such as infection and ulceration.  Patient will begin wearing graduated compression stockings class 1 (20-30 mmHg) or compression wraps on a daily basis a prescription was given. The patient will put the stockings on first thing in the morning and removing them in the evening. The patient is instructed specifically not to sleep in the stockings.    In addition, behavioral modification including several periods of elevation of the lower extremities during the day will be continued. I have demonstrated that proper elevation is a position with the ankles at heart level.  The patient is instructed to begin routine exercise, especially walking on a daily basis   3. Hyperlipidemia, unspecified hyperlipidemia type Continue statin as ordered and reviewed, no changes at this time   4. Type 2 diabetes mellitus with other specified complication, unspecified whether long term  insulin use ([place]) Continue hypoglycemic medications as already ordered, these medications have been reviewed and there are no changes at this time.  Hgb A1C to be monitored as already arranged by primary service   5. Secondary hypertension Continue antihypertensive medications as already ordered, these medications have been reviewed and there are no changes at this time.     Hortencia Pilar, MD  09/29/2018 8:58 AM

## 2018-11-20 ENCOUNTER — Other Ambulatory Visit (INDEPENDENT_AMBULATORY_CARE_PROVIDER_SITE_OTHER): Payer: Federal, State, Local not specified - PPO | Admitting: Vascular Surgery

## 2018-11-24 ENCOUNTER — Encounter (INDEPENDENT_AMBULATORY_CARE_PROVIDER_SITE_OTHER): Payer: Federal, State, Local not specified - PPO

## 2018-12-04 ENCOUNTER — Other Ambulatory Visit (INDEPENDENT_AMBULATORY_CARE_PROVIDER_SITE_OTHER): Payer: Federal, State, Local not specified - PPO | Admitting: Vascular Surgery

## 2018-12-08 ENCOUNTER — Encounter (INDEPENDENT_AMBULATORY_CARE_PROVIDER_SITE_OTHER): Payer: Federal, State, Local not specified - PPO

## 2018-12-23 ENCOUNTER — Encounter (INDEPENDENT_AMBULATORY_CARE_PROVIDER_SITE_OTHER): Payer: Federal, State, Local not specified - PPO

## 2019-01-01 ENCOUNTER — Ambulatory Visit (INDEPENDENT_AMBULATORY_CARE_PROVIDER_SITE_OTHER): Payer: Federal, State, Local not specified - PPO | Admitting: Vascular Surgery

## 2019-01-01 ENCOUNTER — Other Ambulatory Visit: Payer: Self-pay

## 2019-01-01 ENCOUNTER — Encounter (INDEPENDENT_AMBULATORY_CARE_PROVIDER_SITE_OTHER): Payer: Self-pay | Admitting: Vascular Surgery

## 2019-01-01 ENCOUNTER — Other Ambulatory Visit (INDEPENDENT_AMBULATORY_CARE_PROVIDER_SITE_OTHER): Payer: Federal, State, Local not specified - PPO | Admitting: Vascular Surgery

## 2019-01-01 VITALS — BP 115/74 | HR 87 | Resp 12 | Ht 64.0 in | Wt 169.0 lb

## 2019-01-01 DIAGNOSIS — I83813 Varicose veins of bilateral lower extremities with pain: Secondary | ICD-10-CM

## 2019-01-01 NOTE — Progress Notes (Signed)
    MRN : [id number]  Pamela Baldwin is a 63 y.o. (Nov 08, 1955) female who presents with chief complaint of Painful varicose veins.    The patient's left lower extremity was sterilely prepped and draped.  The ultrasound machine was used to visualize the left saphenous vein throughout its course.  A segment of the great saphenous vein was selected for access.  The saphenous vein was accessed without difficulty using ultrasound guidance with a micropuncture needle.   An 0.018  wire was placed beyond the saphenofemoral junction through the sheath and the microneedle was removed.  The 65 cm sheath was then placed over the wire and the wire and dilator were removed.  The laser fiber was placed through the sheath and its tip was placed approximately 2 cm below the saphenofemoral junction.  Tumescent anesthesia was then created with a dilute lidocaine solution.  Laser energy was then delivered with constant withdrawal of the sheath and laser fiber.  Approximately 1420 Joules of energy were delivered over a length of 41 cm.  Sterile dressings were placed.  The patient tolerated the procedure well without complications.

## 2019-01-05 ENCOUNTER — Ambulatory Visit (INDEPENDENT_AMBULATORY_CARE_PROVIDER_SITE_OTHER): Payer: Federal, State, Local not specified - PPO

## 2019-01-05 ENCOUNTER — Other Ambulatory Visit (INDEPENDENT_AMBULATORY_CARE_PROVIDER_SITE_OTHER): Payer: Self-pay | Admitting: Vascular Surgery

## 2019-01-05 ENCOUNTER — Other Ambulatory Visit: Payer: Self-pay

## 2019-01-05 DIAGNOSIS — I83813 Varicose veins of bilateral lower extremities with pain: Secondary | ICD-10-CM

## 2019-01-21 ENCOUNTER — Encounter (INDEPENDENT_AMBULATORY_CARE_PROVIDER_SITE_OTHER): Payer: Self-pay | Admitting: Vascular Surgery

## 2019-01-22 ENCOUNTER — Other Ambulatory Visit: Payer: Self-pay

## 2019-01-22 ENCOUNTER — Encounter (INDEPENDENT_AMBULATORY_CARE_PROVIDER_SITE_OTHER): Payer: Self-pay | Admitting: Vascular Surgery

## 2019-01-22 ENCOUNTER — Ambulatory Visit (INDEPENDENT_AMBULATORY_CARE_PROVIDER_SITE_OTHER): Payer: Federal, State, Local not specified - PPO | Admitting: Vascular Surgery

## 2019-01-22 VITALS — BP 103/63 | HR 83 | Resp 16 | Wt 168.4 lb

## 2019-01-22 DIAGNOSIS — I83813 Varicose veins of bilateral lower extremities with pain: Secondary | ICD-10-CM | POA: Diagnosis not present

## 2019-01-26 ENCOUNTER — Other Ambulatory Visit: Payer: Self-pay

## 2019-01-26 ENCOUNTER — Ambulatory Visit (INDEPENDENT_AMBULATORY_CARE_PROVIDER_SITE_OTHER): Payer: Federal, State, Local not specified - PPO

## 2019-01-26 ENCOUNTER — Other Ambulatory Visit (INDEPENDENT_AMBULATORY_CARE_PROVIDER_SITE_OTHER): Payer: Self-pay | Admitting: Vascular Surgery

## 2019-01-26 DIAGNOSIS — Z9889 Other specified postprocedural states: Secondary | ICD-10-CM

## 2019-01-26 DIAGNOSIS — I83813 Varicose veins of bilateral lower extremities with pain: Secondary | ICD-10-CM | POA: Diagnosis not present

## 2019-01-29 ENCOUNTER — Ambulatory Visit (INDEPENDENT_AMBULATORY_CARE_PROVIDER_SITE_OTHER): Payer: Federal, State, Local not specified - PPO | Admitting: Nurse Practitioner

## 2019-01-29 ENCOUNTER — Encounter (INDEPENDENT_AMBULATORY_CARE_PROVIDER_SITE_OTHER): Payer: Self-pay | Admitting: Vascular Surgery

## 2019-01-29 NOTE — Progress Notes (Signed)
    MRN : [id number]  Pamela Baldwin is a 63 y.o. (1955/09/10) female who presents with chief complaint of No chief complaint on file. .    The patient's right lower extremity was sterilely prepped and draped.  The ultrasound machine was used to visualize the right great saphenous vein throughout its course.  A segment below the knee was selected for access.  The saphenous vein was accessed without difficulty using ultrasound guidance with a micropuncture needle.   An 0.018  wire was placed beyond the saphenofemoral junction through the sheath and the microneedle was removed.  The 65 cm sheath was then placed over the wire and the wire and dilator were removed.  The laser fiber was placed through the sheath and its tip was placed approximately 2 cm below the saphenofemoral junction.  Tumescent anesthesia was then created with a dilute lidocaine solution.  Laser energy was then delivered with constant withdrawal of the sheath and laser fiber.  Approximately 1130 Joules of energy were delivered over a length of 41 cm.  Sterile dressings were placed.  The patient tolerated the procedure well without complications.

## 2019-02-04 ENCOUNTER — Encounter (INDEPENDENT_AMBULATORY_CARE_PROVIDER_SITE_OTHER): Payer: Self-pay

## 2019-02-19 ENCOUNTER — Encounter (INDEPENDENT_AMBULATORY_CARE_PROVIDER_SITE_OTHER): Payer: Self-pay | Admitting: Nurse Practitioner

## 2019-02-19 ENCOUNTER — Other Ambulatory Visit: Payer: Self-pay

## 2019-02-19 ENCOUNTER — Ambulatory Visit (INDEPENDENT_AMBULATORY_CARE_PROVIDER_SITE_OTHER): Payer: Federal, State, Local not specified - PPO | Admitting: Nurse Practitioner

## 2019-02-19 VITALS — BP 119/74 | HR 86 | Resp 18 | Ht 64.0 in | Wt 164.0 lb

## 2019-02-19 DIAGNOSIS — E785 Hyperlipidemia, unspecified: Secondary | ICD-10-CM

## 2019-02-19 DIAGNOSIS — I159 Secondary hypertension, unspecified: Secondary | ICD-10-CM

## 2019-02-19 DIAGNOSIS — Z9889 Other specified postprocedural states: Secondary | ICD-10-CM | POA: Diagnosis not present

## 2019-02-19 DIAGNOSIS — I83813 Varicose veins of bilateral lower extremities with pain: Secondary | ICD-10-CM | POA: Diagnosis not present

## 2019-02-19 DIAGNOSIS — Z79899 Other long term (current) drug therapy: Secondary | ICD-10-CM

## 2019-02-19 NOTE — Progress Notes (Signed)
SUBJECTIVE:  Patient ID: Pamela Baldwin, female    DOB: Jul 22, 1956, 63 y.o.   MRN: [id number] Chief Complaint  Patient presents with  . Follow-up    HPI  Pamela Baldwin is a 63 y.o. female The patient returns to the office for followup status post laser ablation of the left and right great saphenous vein on 01/01/2019 and 01/22/2019, respectively. The patient notes multiple residual varicosities bilaterally which continued to hurt with dependent positions and remained tender to palpation. The patient's swelling is unchanged from preoperative status. The patient continues to wear graduated compression stockings on a daily basis but these are not eliminating the pain and discomfort. The patient continues to use over-the-counter anti-inflammatory medications to treat the pain and related symptoms but this has not given the patient relief. The patient notes the pain in the lower extremities is causing problems with daily exercise, problems at work and even with household activities such as preparing meals and doing dishes.  The patient is otherwise done well and there have been no complications related to the laser procedure or interval changes in the patient's overall   Venous ultrasound post laser shows successful laser ablation of the left and right GSV, no DVT identified.  Past Medical History:  Diagnosis Date  . Hypercholesteremia     Past Surgical History:  Procedure Laterality Date  . ABDOMINAL HYSTERECTOMY  2000  . CATARACT EXTRACTION  2016  . SHOULDER SURGERY      Social History   Socioeconomic History  . Marital status: Married    Spouse name: Not on file  . Number of children: Not on file  . Years of education: Not on file  . Highest education level: Not on file  Occupational History  . Not on file  Social Needs  . Financial resource strain: Not on file  . Food insecurity    Worry: Not on file    Inability: Not on file  . Transportation needs    Medical: Not on  file    Non-medical: Not on file  Tobacco Use  . Smoking status: Never Smoker  . Smokeless tobacco: Never Used  Substance and Sexual Activity  . Alcohol use: Yes  . Drug use: No  . Sexual activity: Yes    Birth control/protection: None  Lifestyle  . Physical activity    Days per week: Not on file    Minutes per session: Not on file  . Stress: Not on file  Relationships  . Social Herbalist on phone: Not on file    Gets together: Not on file    Attends religious service: Not on file    Active member of club or organization: Not on file    Attends meetings of clubs or organizations: Not on file    Relationship status: Not on file  . Intimate partner violence    Fear of current or ex partner: Not on file    Emotionally abused: Not on file    Physically abused: Not on file    Forced sexual activity: Not on file  Other Topics Concern  . Not on file  Social History Narrative  . Not on file    Family History  Problem Relation Age of Onset  . Colon cancer Brother 12  . Pancreatic cancer Brother 47  . Liver cancer Maternal Uncle 13  . Colon cancer Maternal Uncle   . Breast cancer Neg Hx     Allergies  Allergen Reactions  .  Hydrocodone Itching  . Morphine And Related   . Penicillins      Review of Systems   Review of Systems: Negative Unless Checked Constitutional: [] Weight loss  [] Fever  [] Chills Cardiac: [] Chest pain   []  Atrial Fibrillation  [] Palpitations   [] Shortness of breath when laying flat   [] Shortness of breath with exertion. [] Shortness of breath at rest Vascular:  [] Pain in legs with walking   [] Pain in legs with standing [] Pain in legs when laying flat   [] Claudication    [] Pain in feet when laying flat    [] History of DVT   [] Phlebitis   [x] Swelling in legs   [x] Varicose veins   [] Non-healing ulcers Pulmonary:   [] Uses home oxygen   [] Productive cough   [] Hemoptysis   [] Wheeze  [] COPD   [] Asthma Neurologic:  [] Dizziness   [] Seizures   [] Blackouts [] History of stroke   [] History of TIA  [] Aphasia   [] Temporary Blindness   [] Weakness or numbness in arm   [] Weakness or numbness in leg Musculoskeletal:   [] Joint swelling   [] Joint pain   [] Low back pain  []  History of Knee Replacement [] Arthritis [] back Surgeries  []  Spinal Stenosis    Hematologic:  [] Easy bruising  [] Easy bleeding   [] Hypercoagulable state   [] Anemic Gastrointestinal:  [] Diarrhea   [] Vomiting  [] Gastroesophageal reflux/heartburn   [] Difficulty swallowing. [] Abdominal pain Genitourinary:  [] Chronic kidney disease   [] Difficult urination  [] Anuric   [] Blood in urine [] Frequent urination  [] Burning with urination   [] Hematuria Skin:  [] Rashes   [] Ulcers [] Wounds Psychological:  [] History of anxiety   []  History of major depression  []  Memory Difficulties      OBJECTIVE:   Physical Exam  BP 119/74 (BP Location: Left Arm)   Pulse 86   Resp 18   Ht 5\' 4"  (1.626 m)   Wt 164 lb (74.4 kg)   BMI 28.15 kg/m   Gen: WD/WN, NAD Head: [place]/AT, No temporalis wasting.  Ear/Nose/Throat: Hearing grossly intact, nares w/o erythema or drainage Eyes: PER, EOMI, sclera nonicteric.  Neck: Supple, no masses.  No JVD.  Pulmonary:  Good air movement, no use of accessory muscles.  Cardiac: RRR Vascular: scattered varicosities present bilaterally.  Mild venous stasis changes to the legs bilaterally.  2+ soft pitting edema  Vessel Right Left  Radial Palpable Palpable  Dorsalis Pedis Palpable Palpable  Posterior Tibial Palpable Palpable   Gastrointestinal: soft, non-distended. No guarding/no peritoneal signs.  Musculoskeletal: M/S 5/5 throughout.  No deformity or atrophy.  Neurologic: Pain and light touch intact in extremities.  Symmetrical.  Speech is fluent. Motor exam as listed above. Psychiatric: Judgment intact, Mood & affect appropriate for pt's clinical situation. Dermatologic: No Venous rashes. No Ulcers Noted.  No changes consistent with cellulitis. Lymph : No  Cervical lymphadenopathy, no lichenification or skin changes of chronic lymphedema.       ASSESSMENT AND PLAN:  1. Varicose veins of both lower extremities with pain Recommend:  The patient has had successful ablation of the previously incompetent saphenous venous system but still has persistent symptoms of pain and swelling that are having a negative impact on daily life and daily activities.  Patient should undergo injection sclerotherapy to treat the residual varicosities.  The risks, benefits and alternative therapies were reviewed in detail with the patient.  All questions were answered.  The patient agrees to proceed with sclerotherapy at their convenience.  The patient will continue wearing the graduated compression stockings and using the over-the-counter pain medications  to treat her symptoms.       2. Secondary hypertension Continue antihypertensive medications as already ordered, these medications have been reviewed and there are no changes at this time.   3. Hyperlipidemia, unspecified hyperlipidemia type Continue statin as ordered and reviewed, no changes at this time    Current Outpatient Medications on File Prior to Visit  Medication Sig Dispense Refill  . atorvastatin (LIPITOR) 40 MG tablet     . baclofen (LIORESAL) 10 MG tablet     . cetirizine (ZYRTEC) 10 MG tablet Take by mouth daily as needed.     . diazepam (VALIUM) 5 MG tablet Take by mouth every 8 (eight) hours as needed.     Marland Kitchen lisinopril (PRINIVIL,ZESTRIL) 10 MG tablet Take 10 mg by mouth daily.    . metFORMIN (GLUCOPHAGE) 500 MG tablet Take by mouth 2 (two) times daily with a meal.    . mometasone (ELOCON) 0.1 % ointment mometasone 0.1 % topical ointment    . triamcinolone cream (KENALOG) 0.1 % Apply 1 application topically daily as needed.     . diclofenac (VOLTAREN) 75 MG EC tablet 75 mg as needed.      No current facility-administered medications on file prior to visit.     There are no Patient  Instructions on file for this visit. No follow-ups on file.   Kris Hartmann, NP  This note was completed with Sales executive.  Any errors are purely unintentional.

## 2019-02-24 ENCOUNTER — Other Ambulatory Visit (HOSPITAL_COMMUNITY): Payer: Self-pay | Admitting: Orthopedic Surgery

## 2019-02-24 ENCOUNTER — Other Ambulatory Visit: Payer: Self-pay | Admitting: Orthopedic Surgery

## 2019-02-24 DIAGNOSIS — M25511 Pain in right shoulder: Secondary | ICD-10-CM

## 2019-02-25 ENCOUNTER — Encounter (INDEPENDENT_AMBULATORY_CARE_PROVIDER_SITE_OTHER): Payer: Self-pay | Admitting: Nurse Practitioner

## 2019-03-04 ENCOUNTER — Other Ambulatory Visit: Payer: Self-pay

## 2019-03-04 ENCOUNTER — Ambulatory Visit (INDEPENDENT_AMBULATORY_CARE_PROVIDER_SITE_OTHER): Payer: Federal, State, Local not specified - PPO | Admitting: Vascular Surgery

## 2019-03-04 ENCOUNTER — Encounter (INDEPENDENT_AMBULATORY_CARE_PROVIDER_SITE_OTHER): Payer: Self-pay | Admitting: Vascular Surgery

## 2019-03-04 VITALS — BP 128/71 | HR 86 | Resp 16 | Ht 64.0 in | Wt 167.0 lb

## 2019-03-04 DIAGNOSIS — I83813 Varicose veins of bilateral lower extremities with pain: Secondary | ICD-10-CM

## 2019-03-04 NOTE — Progress Notes (Signed)
Varicose veins of bilateral  lower extremity with inflammation (454.1  I83.10) Current Plans   Indication: Patient presents with symptomatic varicose veins of the bilateral  lower extremity.   Procedure: Sclerotherapy using hypertonic saline mixed with 1% Lidocaine was performed on the bilateral lower extremity. Compression wraps were placed. The patient tolerated the procedure well. 

## 2019-03-08 ENCOUNTER — Ambulatory Visit
Admission: RE | Admit: 2019-03-08 | Discharge: 2019-03-08 | Disposition: A | Discharge status: Home / Self Care | Payer: Federal, State, Local not specified - PPO | Source: Ambulatory Visit | Attending: Orthopedic Surgery | Admitting: Orthopedic Surgery

## 2019-03-08 ENCOUNTER — Other Ambulatory Visit: Payer: Self-pay

## 2019-03-08 DIAGNOSIS — M25511 Pain in right shoulder: Secondary | ICD-10-CM | POA: Insufficient documentation

## 2019-03-25 ENCOUNTER — Ambulatory Visit (INDEPENDENT_AMBULATORY_CARE_PROVIDER_SITE_OTHER): Payer: Federal, State, Local not specified - PPO | Admitting: Vascular Surgery

## 2019-03-25 ENCOUNTER — Encounter (INDEPENDENT_AMBULATORY_CARE_PROVIDER_SITE_OTHER): Payer: Self-pay | Admitting: Vascular Surgery

## 2019-03-25 ENCOUNTER — Other Ambulatory Visit: Payer: Self-pay

## 2019-03-25 VITALS — BP 134/77 | HR 88 | Resp 16 | Ht 64.0 in | Wt 166.0 lb

## 2019-03-25 DIAGNOSIS — I83813 Varicose veins of bilateral lower extremities with pain: Secondary | ICD-10-CM

## 2019-03-25 NOTE — Progress Notes (Signed)
Varicose veins of bilateral  lower extremity with inflammation (454.1  I83.10) Current Plans   Indication: Patient presents with symptomatic varicose veins of the bilateral  lower extremity.   Procedure: Sclerotherapy using hypertonic saline mixed with 1% Lidocaine was performed on the bilateral lower extremity. Compression wraps were placed. The patient tolerated the procedure well. 

## 2019-03-27 ENCOUNTER — Other Ambulatory Visit: Payer: Self-pay | Admitting: Orthopedic Surgery

## 2019-04-15 ENCOUNTER — Ambulatory Visit (INDEPENDENT_AMBULATORY_CARE_PROVIDER_SITE_OTHER): Payer: Federal, State, Local not specified - PPO | Admitting: Vascular Surgery

## 2019-04-24 ENCOUNTER — Other Ambulatory Visit: Payer: Federal, State, Local not specified - PPO

## 2019-04-24 ENCOUNTER — Encounter
Admission: RE | Admit: 2019-04-24 | Discharge: 2019-04-24 | Disposition: A | Discharge status: Home / Self Care | Payer: Medicare Other | Source: Ambulatory Visit | Attending: Orthopedic Surgery | Admitting: Orthopedic Surgery

## 2019-04-24 ENCOUNTER — Other Ambulatory Visit: Payer: Self-pay

## 2019-04-24 DIAGNOSIS — E119 Type 2 diabetes mellitus without complications: Secondary | ICD-10-CM | POA: Insufficient documentation

## 2019-04-24 DIAGNOSIS — Z20828 Contact with and (suspected) exposure to other viral communicable diseases: Secondary | ICD-10-CM | POA: Diagnosis not present

## 2019-04-24 DIAGNOSIS — Z01818 Encounter for other preprocedural examination: Secondary | ICD-10-CM | POA: Diagnosis present

## 2019-04-24 DIAGNOSIS — M75121 Complete rotator cuff tear or rupture of right shoulder, not specified as traumatic: Secondary | ICD-10-CM | POA: Diagnosis not present

## 2019-04-24 HISTORY — DX: Type 2 diabetes mellitus without complications: E11.9

## 2019-04-24 HISTORY — DX: Essential (primary) hypertension: I10

## 2019-04-24 LAB — CBC WITH DIFFERENTIAL/PLATELET
Abs Immature Granulocytes: 0.03 10*3/uL (ref 0.00–0.07)
Basophils Absolute: 0 10*3/uL (ref 0.0–0.1)
Basophils Relative: 1 %
Eosinophils Absolute: 0.4 10*3/uL (ref 0.0–0.5)
Eosinophils Relative: 6 %
HCT: 38.6 % (ref 36.0–46.0)
Hemoglobin: 12.4 g/dL (ref 12.0–15.0)
Immature Granulocytes: 1 %
Lymphocytes Relative: 32 %
Lymphs Abs: 2.1 10*3/uL (ref 0.7–4.0)
MCH: 29.8 pg (ref 26.0–34.0)
MCHC: 32.1 g/dL (ref 30.0–36.0)
MCV: 92.8 fL (ref 80.0–100.0)
Monocytes Absolute: 0.5 10*3/uL (ref 0.1–1.0)
Monocytes Relative: 7 %
Neutro Abs: 3.5 10*3/uL (ref 1.7–7.7)
Neutrophils Relative %: 53 %
Platelets: 269 10*3/uL (ref 150–400)
RBC: 4.16 MIL/uL (ref 3.87–5.11)
RDW: 13.2 % (ref 11.5–15.5)
WBC: 6.6 10*3/uL (ref 4.0–10.5)
nRBC: 0 % (ref 0.0–0.2)

## 2019-04-24 LAB — BASIC METABOLIC PANEL
Anion gap: 12 (ref 5–15)
BUN: 11 mg/dL (ref 8–23)
CO2: 25 mmol/L (ref 22–32)
Calcium: 9.7 mg/dL (ref 8.9–10.3)
Chloride: 103 mmol/L (ref 98–111)
Creatinine, Ser: 0.82 mg/dL (ref 0.44–1.00)
GFR calc Af Amer: >60 mL/min (ref >60)
GFR calc non Af Amer: >60 mL/min (ref >60)
Glucose, Bld: 90 mg/dL (ref 70–99)
Potassium: 4 mmol/L (ref 3.5–5.1)
Sodium: 140 mmol/L (ref 135–145)

## 2019-04-24 LAB — PROTIME-INR
INR: 1 (ref 0.8–1.2)
Prothrombin Time: 13.1 seconds (ref 11.4–15.2)

## 2019-04-24 LAB — SARS CORONAVIRUS 2 (TAT 6-24 HRS): SARS Coronavirus 2: NEGATIVE

## 2019-04-24 LAB — APTT: aPTT: 29 seconds (ref 24–36)

## 2019-04-24 NOTE — Patient Instructions (Signed)
Your procedure is scheduled on: Tuesday 04/28/19.  Report to DAY SURGERY DEPARTMENT LOCATED ON 2ND FLOOR MEDICAL MALL ENTRANCE. To find out your arrival time please call [phone] between 1PM - 3PM on Today.   Remember: Instructions that are not followed completely may result in serious medical risk, up to and including death, or upon the discretion of your surgeon and anesthesiologist your surgery may need to be rescheduled.      _X__ 1. Do not eat food after midnight the night before your procedure.                 No gum chewing or hard candies. You may drink clear liquids up to 2 hours                 before you are scheduled to arrive for your surgery- DO NOT drink clear                 liquids within 2 hours of the start of your surgery.                 Clear Liquids include:  water, Fleming juice without pulp, clear carbohydrate                 drink such as Clearfast or Gatorade, Black Coffee or Tea (Do not add                 milk or creamer to coffee or tea).   __X__2.  On the morning of surgery brush your teeth with toothpaste and water, you may rinse your mouth with mouthwash if you wish.  Do not swallow any toothpaste or mouthwash.      _X__ 3.  No Alcohol for 24 hours before or after surgery.    __X__6.  Notify your doctor if there is any change in your medical condition      (cold, fever, infections).      Do not wear jewelry, make-up, hairpins, clips or nail polish. Do not wear lotions, powders, or perfumes.  Do not shave 48 hours prior to surgery. Men may shave face and neck. Do not bring valuables to the hospital.    [facility] is not responsible for any belongings or valuables.  Contacts, dentures/partials or body piercings may not be worn into surgery. Bring a case for your contacts, glasses or hearing aids, a denture cup will be supplied.    Patients discharged the day of surgery will not be allowed to drive home.    Please read over the following fact  sheets that you were given:   MRSA Information    __X__ Take these medicines the morning of surgery with A SIP OF WATER:     1. None    __X__ Use CHG Soap as directed   __X__ Stop Metformin 2 days before your surgery. Last does Saturday 04/25/19.   __X__ Stop Anti-inflammatories 7 days before surgery such as Advil, Ibuprofen, Motrin, BC or Goodies Powder, Naprosyn, Naproxen, Aleve, Aspirin, Meloxicam. May take Tylenol if needed for pain or discomfort.    __X__ Don't begin taking any new herbal supplements before your surgery.

## 2019-04-28 ENCOUNTER — Ambulatory Visit: Payer: Medicare Other | Admitting: Anesthesiology

## 2019-04-28 ENCOUNTER — Encounter: Payer: Self-pay | Admitting: Emergency Medicine

## 2019-04-28 ENCOUNTER — Other Ambulatory Visit: Payer: Self-pay

## 2019-04-28 ENCOUNTER — Ambulatory Visit
Admission: RE | Admit: 2019-04-28 | Discharge: 2019-04-28 | Disposition: A | Discharge status: Home / Self Care | Payer: Medicare Other | Attending: Orthopedic Surgery | Admitting: Orthopedic Surgery

## 2019-04-28 ENCOUNTER — Ambulatory Visit: Payer: Medicare Other

## 2019-04-28 ENCOUNTER — Encounter
Admission: RE | Disposition: A | Discharge status: Home / Self Care | Payer: Self-pay | Source: Home / Self Care | Attending: Orthopedic Surgery

## 2019-04-28 DIAGNOSIS — Z79899 Other long term (current) drug therapy: Secondary | ICD-10-CM | POA: Insufficient documentation

## 2019-04-28 DIAGNOSIS — X58XXXA Exposure to other specified factors, initial encounter: Secondary | ICD-10-CM | POA: Insufficient documentation

## 2019-04-28 DIAGNOSIS — E119 Type 2 diabetes mellitus without complications: Secondary | ICD-10-CM | POA: Diagnosis not present

## 2019-04-28 DIAGNOSIS — M19011 Primary osteoarthritis, right shoulder: Secondary | ICD-10-CM | POA: Insufficient documentation

## 2019-04-28 DIAGNOSIS — Z7984 Long term (current) use of oral hypoglycemic drugs: Secondary | ICD-10-CM | POA: Insufficient documentation

## 2019-04-28 DIAGNOSIS — M7541 Impingement syndrome of right shoulder: Secondary | ICD-10-CM | POA: Insufficient documentation

## 2019-04-28 DIAGNOSIS — S43431A Superior glenoid labrum lesion of right shoulder, initial encounter: Secondary | ICD-10-CM | POA: Diagnosis not present

## 2019-04-28 DIAGNOSIS — S46111A Strain of muscle, fascia and tendon of long head of biceps, right arm, initial encounter: Secondary | ICD-10-CM | POA: Diagnosis not present

## 2019-04-28 DIAGNOSIS — E7800 Pure hypercholesterolemia, unspecified: Secondary | ICD-10-CM | POA: Insufficient documentation

## 2019-04-28 DIAGNOSIS — I10 Essential (primary) hypertension: Secondary | ICD-10-CM | POA: Diagnosis not present

## 2019-04-28 DIAGNOSIS — M75121 Complete rotator cuff tear or rupture of right shoulder, not specified as traumatic: Secondary | ICD-10-CM | POA: Insufficient documentation

## 2019-04-28 HISTORY — PX: SHOULDER ARTHROSCOPY WITH OPEN ROTATOR CUFF REPAIR: SHX6092

## 2019-04-28 LAB — GLUCOSE, CAPILLARY
Glucose-Capillary: 120 mg/dL — ABNORMAL HIGH (ref 70–99)
Glucose-Capillary: 138 mg/dL — ABNORMAL HIGH (ref 70–99)

## 2019-04-28 SURGERY — ARTHROSCOPY, SHOULDER WITH REPAIR, ROTATOR CUFF, OPEN
Anesthesia: General | Laterality: Right

## 2019-04-28 MED ORDER — BUPIVACAINE HCL (PF) 0.5 % IJ SOLN
INTRAMUSCULAR | Status: DC | PRN
  Administered 2019-04-28: 10 mL via PERINEURAL

## 2019-04-28 MED ORDER — PHENYLEPHRINE HCL (PRESSORS) 10 MG/ML IV SOLN
INTRAVENOUS | Status: AC
  Filled 2019-04-28: qty 1

## 2019-04-28 MED ORDER — BUPIVACAINE LIPOSOME 1.3 % IJ SUSP
INTRAMUSCULAR | Status: DC | PRN
  Administered 2019-04-28: 20 mL via PERINEURAL

## 2019-04-28 MED ORDER — ONDANSETRON HCL 4 MG/2ML IJ SOLN
INTRAMUSCULAR | Status: AC
  Filled 2019-04-28: qty 2

## 2019-04-28 MED ORDER — SODIUM CHLORIDE 0.9 % IV SOLN
INTRAVENOUS | Status: DC | PRN
  Administered 2019-04-28: 50 ug/min via INTRAVENOUS

## 2019-04-28 MED ORDER — FENTANYL CITRATE (PF) 100 MCG/2ML IJ SOLN
50.0000 ug | Freq: Once | INTRAMUSCULAR | Status: AC
  Administered 2019-04-28: 07:00:00 50 ug via INTRAVENOUS

## 2019-04-28 MED ORDER — ONDANSETRON HCL 4 MG/2ML IJ SOLN: 4.0000 mg | Freq: Once | INTRAMUSCULAR | Status: DC | PRN

## 2019-04-28 MED ORDER — FENTANYL CITRATE (PF) 100 MCG/2ML IJ SOLN
INTRAMUSCULAR | Status: DC | PRN
  Administered 2019-04-28: 50 ug via INTRAVENOUS

## 2019-04-28 MED ORDER — EPINEPHRINE PF 1 MG/ML IJ SOLN
INTRAMUSCULAR | Status: AC
  Filled 2019-04-28: qty 4

## 2019-04-28 MED ORDER — MIDAZOLAM HCL 2 MG/2ML IJ SOLN
INTRAMUSCULAR | Status: AC
  Filled 2019-04-28: qty 2

## 2019-04-28 MED ORDER — ACETAMINOPHEN 10 MG/ML IV SOLN
INTRAVENOUS | Status: AC
  Filled 2019-04-28: qty 100

## 2019-04-28 MED ORDER — CHLORHEXIDINE GLUCONATE CLOTH 2 % EX PADS: 6.0000 | MEDICATED_PAD | Freq: Once | CUTANEOUS | Status: DC

## 2019-04-28 MED ORDER — BUPIVACAINE HCL (PF) 0.25 % IJ SOLN
INTRAMUSCULAR | Status: AC
  Filled 2019-04-28: qty 30

## 2019-04-28 MED ORDER — SODIUM CHLORIDE 0.9 % IV SOLN
INTRAVENOUS | Status: DC
  Administered 2019-04-28: 06:00:00 via INTRAVENOUS

## 2019-04-28 MED ORDER — LIDOCAINE HCL (PF) 1 % IJ SOLN
INTRAMUSCULAR | Status: AC
  Filled 2019-04-28: qty 30

## 2019-04-28 MED ORDER — BUPIVACAINE HCL (PF) 0.5 % IJ SOLN
INTRAMUSCULAR | Status: AC
  Filled 2019-04-28: qty 10

## 2019-04-28 MED ORDER — MIDAZOLAM HCL 2 MG/2ML IJ SOLN
INTRAMUSCULAR | Status: DC | PRN
  Administered 2019-04-28: 1 mg via INTRAVENOUS

## 2019-04-28 MED ORDER — CLINDAMYCIN PHOSPHATE 600 MG/50ML IV SOLN
INTRAVENOUS | Status: AC
  Filled 2019-04-28: qty 50

## 2019-04-28 MED ORDER — DULCOLAX 5 MG PO TBEC: 10.0000 mg | DELAYED_RELEASE_TABLET | Freq: Every day | ORAL | 1 refills | Status: AC | PRN

## 2019-04-28 MED ORDER — DEXAMETHASONE SODIUM PHOSPHATE 10 MG/ML IJ SOLN
INTRAMUSCULAR | Status: AC
  Filled 2019-04-28: qty 1

## 2019-04-28 MED ORDER — LIDOCAINE HCL (PF) 1 % IJ SOLN
INTRAMUSCULAR | Status: DC | PRN
  Administered 2019-04-28: 3 mL

## 2019-04-28 MED ORDER — BUPIVACAINE LIPOSOME 1.3 % IJ SUSP
INTRAMUSCULAR | Status: AC
  Filled 2019-04-28: qty 20

## 2019-04-28 MED ORDER — PROPOFOL 10 MG/ML IV BOLUS
INTRAVENOUS | Status: AC
  Filled 2019-04-28: qty 20

## 2019-04-28 MED ORDER — ACETAMINOPHEN 10 MG/ML IV SOLN
INTRAVENOUS | Status: DC | PRN
  Administered 2019-04-28: 1000 mg via INTRAVENOUS

## 2019-04-28 MED ORDER — ONDANSETRON HCL 4 MG PO TABS: 4.0000 mg | ORAL_TABLET | Freq: Three times a day (TID) | ORAL | 0 refills | Status: DC | PRN

## 2019-04-28 MED ORDER — EPHEDRINE SULFATE 50 MG/ML IJ SOLN
INTRAMUSCULAR | Status: AC
  Filled 2019-04-28: qty 1

## 2019-04-28 MED ORDER — LIDOCAINE HCL (PF) 1 % IJ SOLN
INTRAMUSCULAR | Status: AC
  Filled 2019-04-28: qty 5

## 2019-04-28 MED ORDER — MIDAZOLAM HCL 2 MG/2ML IJ SOLN
1.0000 mg | Freq: Once | INTRAMUSCULAR | Status: AC
  Administered 2019-04-28: 07:00:00 1 mg via INTRAVENOUS

## 2019-04-28 MED ORDER — FAMOTIDINE 20 MG PO TABS
20.0000 mg | ORAL_TABLET | Freq: Once | ORAL | Status: AC
  Administered 2019-04-28: 06:00:00 20 mg via ORAL

## 2019-04-28 MED ORDER — SEVOFLURANE IN SOLN
RESPIRATORY_TRACT | Status: AC
  Filled 2019-04-28: qty 250

## 2019-04-28 MED ORDER — LIDOCAINE HCL (PF) 2 % IJ SOLN
INTRAMUSCULAR | Status: AC
  Filled 2019-04-28: qty 10

## 2019-04-28 MED ORDER — FENTANYL CITRATE (PF) 100 MCG/2ML IJ SOLN: 25.0000 ug | INTRAMUSCULAR | Status: DC | PRN

## 2019-04-28 MED ORDER — NEOMYCIN-POLYMYXIN B GU 40-200000 IR SOLN
Status: AC
  Filled 2019-04-28: qty 2

## 2019-04-28 MED ORDER — MIDAZOLAM HCL 2 MG/2ML IJ SOLN
INTRAMUSCULAR | Status: AC
  Administered 2019-04-28: 1 mg via INTRAVENOUS
  Filled 2019-04-28: qty 2

## 2019-04-28 MED ORDER — ONDANSETRON HCL 4 MG/2ML IJ SOLN
INTRAMUSCULAR | Status: DC | PRN
  Administered 2019-04-28: 4 mg via INTRAVENOUS

## 2019-04-28 MED ORDER — DOCUSATE SODIUM 100 MG PO CAPS: 100.0000 mg | ORAL_CAPSULE | Freq: Two times a day (BID) | ORAL | 2 refills | Status: AC

## 2019-04-28 MED ORDER — SUGAMMADEX SODIUM 200 MG/2ML IV SOLN
INTRAVENOUS | Status: DC | PRN
  Administered 2019-04-28: 150 mg via INTRAVENOUS

## 2019-04-28 MED ORDER — FENTANYL CITRATE (PF) 100 MCG/2ML IJ SOLN
INTRAMUSCULAR | Status: AC
  Administered 2019-04-28: 50 ug via INTRAVENOUS
  Filled 2019-04-28: qty 2

## 2019-04-28 MED ORDER — OXYCODONE HCL 5 MG PO TABS: 5.0000 mg | ORAL_TABLET | ORAL | 0 refills | Status: DC | PRN

## 2019-04-28 MED ORDER — DEXAMETHASONE SODIUM PHOSPHATE 10 MG/ML IJ SOLN
INTRAMUSCULAR | Status: DC | PRN
  Administered 2019-04-28: 8 mg via INTRAVENOUS

## 2019-04-28 MED ORDER — ROCURONIUM BROMIDE 50 MG/5ML IV SOLN
INTRAVENOUS | Status: AC
  Filled 2019-04-28: qty 1

## 2019-04-28 MED ORDER — PROPOFOL 10 MG/ML IV BOLUS
INTRAVENOUS | Status: DC | PRN
  Administered 2019-04-28: 140 mg via INTRAVENOUS

## 2019-04-28 MED ORDER — ROCURONIUM BROMIDE 100 MG/10ML IV SOLN
INTRAVENOUS | Status: DC | PRN
  Administered 2019-04-28: 40 mg via INTRAVENOUS

## 2019-04-28 MED ORDER — LIDOCAINE HCL (CARDIAC) PF 100 MG/5ML IV SOSY
PREFILLED_SYRINGE | INTRAVENOUS | Status: DC | PRN
  Administered 2019-04-28: 100 mg via INTRAVENOUS

## 2019-04-28 MED ORDER — FAMOTIDINE 20 MG PO TABS
ORAL_TABLET | ORAL | Status: AC
  Administered 2019-04-28: 20 mg via ORAL
  Filled 2019-04-28: qty 1

## 2019-04-28 MED ORDER — FENTANYL CITRATE (PF) 100 MCG/2ML IJ SOLN
INTRAMUSCULAR | Status: AC
  Filled 2019-04-28: qty 2

## 2019-04-28 MED ORDER — CLINDAMYCIN PHOSPHATE 600 MG/50ML IV SOLN
600.0000 mg | INTRAVENOUS | Status: AC
  Administered 2019-04-28: 600 mg via INTRAVENOUS

## 2019-04-28 MED ORDER — SUCCINYLCHOLINE CHLORIDE 20 MG/ML IJ SOLN
INTRAMUSCULAR | Status: AC
  Filled 2019-04-28: qty 1

## 2019-04-28 SURGICAL SUPPLY — 71 items
ADAPTER IRRIG TUBE 2 SPIKE SOL (ADAPTER) ×4 IMPLANT
ANCHOR ALL-SUT Q-FIX 2.8 (Anchor) ×2 IMPLANT
ANCHOR SUT 5.5 MULTIFIX (Orthopedic Implant) ×2 IMPLANT
BUR RADIUS 4.0X18.5 (BURR) ×2 IMPLANT
BUR RADIUS 5.5 (BURR) ×2 IMPLANT
CANNULA 5.75X7 CRYSTAL CLEAR (CANNULA) ×4 IMPLANT
CANNULA PARTIAL THREAD 2X7 (CANNULA) ×2 IMPLANT
CANNULA TWIST IN 8.25X9CM (CANNULA) IMPLANT
CONNECTOR PERFECT PASSER (CONNECTOR) ×1 IMPLANT
COOLER POLAR GLACIER W/PUMP (MISCELLANEOUS) ×2 IMPLANT
COVER WAND RF STERILE (DRAPES) ×2 IMPLANT
CRADLE LAMINECT ARM (MISCELLANEOUS) ×2 IMPLANT
DEVICE SUCT BLK HOLE OR FLOOR (MISCELLANEOUS) ×1 IMPLANT
DRAPE 3/4 80X56 (DRAPES) ×2 IMPLANT
DRAPE INCISE IOBAN 66X45 STRL (DRAPES) ×2 IMPLANT
DRAPE SPLIT 6X30 W/TAPE (DRAPES) ×4 IMPLANT
DRAPE U-SHAPE 47X51 STRL (DRAPES) IMPLANT
DURAPREP 26ML APPLICATOR (WOUND CARE) ×6 IMPLANT
ELECT REM PT RETURN 9FT ADLT (ELECTROSURGICAL) ×2
ELECTRODE REM PT RTRN 9FT ADLT (ELECTROSURGICAL) ×1 IMPLANT
GAUZE SPONGE 4X4 12PLY STRL (GAUZE/BANDAGES/DRESSINGS) ×4 IMPLANT
GAUZE XEROFORM 1X8 LF (GAUZE/BANDAGES/DRESSINGS) ×2 IMPLANT
GLOVE BIOGEL PI IND STRL 9 (GLOVE) ×1 IMPLANT
GLOVE BIOGEL PI INDICATOR 9 (GLOVE) ×1
GLOVE SURG 9.0 ORTHO LTXF (GLOVE) ×4 IMPLANT
GOWN STRL REUS TWL 2XL XL LVL4 (GOWN DISPOSABLE) ×2 IMPLANT
GOWN STRL REUS W/ TWL LRG LVL3 (GOWN DISPOSABLE) ×1 IMPLANT
GOWN STRL REUS W/ TWL LRG LVL4 (GOWN DISPOSABLE) ×1 IMPLANT
GOWN STRL REUS W/TWL LRG LVL3 (GOWN DISPOSABLE) ×1
GOWN STRL REUS W/TWL LRG LVL4 (GOWN DISPOSABLE) ×1
IV LACTATED RINGER IRRG 3000ML (IV SOLUTION) ×8
IV LR IRRIG 3000ML ARTHROMATIC (IV SOLUTION) ×6 IMPLANT
KIT STABILIZATION SHOULDER (MISCELLANEOUS) ×2 IMPLANT
KIT SUTURE 2.8 Q-FIX DISP (MISCELLANEOUS) ×1 IMPLANT
KIT SUTURETAK 3.0 INSERT PERC (KITS) IMPLANT
KIT TURNOVER KIT A (KITS) ×2 IMPLANT
MANIFOLD NEPTUNE II (INSTRUMENTS) ×2 IMPLANT
MASK FACE SPIDER DISP (MASK) ×2 IMPLANT
MAT ABSORB  FLUID 56X50 GRAY (MISCELLANEOUS) ×2
MAT ABSORB FLUID 56X50 GRAY (MISCELLANEOUS) ×2 IMPLANT
NDL SAFETY ECLIPSE 18X1.5 (NEEDLE) ×1 IMPLANT
NEEDLE HYPO 18GX1.5 SHARP (NEEDLE) ×1
NEEDLE HYPO 22GX1.5 SAFETY (NEEDLE) ×2 IMPLANT
NS IRRIG 500ML POUR BTL (IV SOLUTION) ×2 IMPLANT
PACK ARTHROSCOPY SHOULDER (MISCELLANEOUS) ×2 IMPLANT
PAD WRAPON POLAR SHDR XLG (MISCELLANEOUS) ×1 IMPLANT
PASSER SUT CAPTURE FIRST (SUTURE) IMPLANT
PASSER SUT FIRSTPASS SELF (INSTRUMENTS) ×1 IMPLANT
SET TUBE SUCT SHAVER OUTFL 24K (TUBING) ×2 IMPLANT
SET TUBE TIP INTRA-ARTICULAR (MISCELLANEOUS) ×2 IMPLANT
STRAP SAFETY 5IN WIDE (MISCELLANEOUS) ×2 IMPLANT
STRIP CLOSURE SKIN 1/2X4 (GAUZE/BANDAGES/DRESSINGS) ×4 IMPLANT
SUT ETHILON 4-0 (SUTURE) ×1
SUT ETHILON 4-0 FS2 18XMFL BLK (SUTURE) ×1
SUT LASSO 90 DEG SD STR (SUTURE) IMPLANT
SUT MNCRL 4-0 (SUTURE) ×1
SUT MNCRL 4-0 27XMFL (SUTURE) ×1
SUT PDS AB 0 CT1 27 (SUTURE) ×2 IMPLANT
SUT PERFECTPASSER WHITE CART (SUTURE) ×2 IMPLANT
SUT SMART STITCH CARTRIDGE (SUTURE) ×3 IMPLANT
SUT VIC AB 0 CT1 36 (SUTURE) ×2 IMPLANT
SUT VIC AB 2-0 CT2 27 (SUTURE) ×2 IMPLANT
SUTURE ETHLN 4-0 FS2 18XMF BLK (SUTURE) ×1 IMPLANT
SUTURE MAGNUM WIRE 2X48 BLK (SUTURE) IMPLANT
SUTURE MNCRL 4-0 27XMF (SUTURE) ×1 IMPLANT
SYR 10ML LL (SYRINGE) ×2 IMPLANT
TAPE MICROFOAM 4IN (TAPE) ×2 IMPLANT
TUBING ARTHRO INFLOW-ONLY STRL (TUBING) ×2 IMPLANT
TUBING CONNECTING 10 (TUBING) ×2 IMPLANT
WAND HAND CNTRL MULTIVAC 90 (MISCELLANEOUS) ×2 IMPLANT
WRAPON POLAR PAD SHDR XLG (MISCELLANEOUS) ×2

## 2019-04-28 NOTE — Anesthesia Procedure Notes (Signed)
Procedure Name: Intubation Date/Time: 04/28/2019 8:01 AM Performed by: Johnna Acosta, CRNA Pre-anesthesia Checklist: Patient identified, Emergency Drugs available, Suction available, Patient being monitored and Timeout performed Patient Re-evaluated:Patient Re-evaluated prior to induction Oxygen Delivery Method: Circle system utilized Preoxygenation: Pre-oxygenation with 100% oxygen Induction Type: IV induction Ventilation: Mask ventilation without difficulty Laryngoscope Size: Miller and 2 Grade View: Grade I Tube type: Oral Tube size: 7.0 mm Number of attempts: 1 Airway Equipment and Method: Stylet Placement Confirmation: ETT inserted through vocal cords under direct vision,  positive ETCO2 and breath sounds checked- equal and bilateral Secured at: 20 cm Tube secured with: Tape Dental Injury: Teeth and Oropharynx as per pre-operative assessment

## 2019-04-28 NOTE — H&P (Signed)
PREOPERATIVE H&P  Chief Complaint: M75.121 complete rotator cuff tear or rupture of right shoulder  HPI: Pamela Baldwin is a 63 y.o. female who presents for preoperative history and physical with a diagnosis of M75.121 complete rotator cuff tear or rupture of right shoulder. Symptoms of pain, weakness and limited range of motion are significantly impairing activities of daily living.  MRI has confirmed full-thickness rotator cuff tear  She has undergone successful left shoulder rotator cuff repair and now wished to proceed with surgical repair of her right shoulder.   Past Medical History:  Diagnosis Date  . Diabetes mellitus without complication ([place])   . Hypercholesteremia   . Hypertension    Past Surgical History:  Procedure Laterality Date  . ABDOMINAL HYSTERECTOMY  2000  . BUNIONECTOMY    . CATARACT EXTRACTION  2016  . CERVICAL DISC SURGERY    . ELBOW BURSA SURGERY    . KNEE ARTHROSCOPY    . SHOULDER SURGERY    . TONSILLECTOMY     Social History   Socioeconomic History  . Marital status: Married    Spouse name: Not on file  . Number of children: Not on file  . Years of education: Not on file  . Highest education level: Not on file  Occupational History  . Not on file  Social Needs  . Financial resource strain: Not on file  . Food insecurity    Worry: Not on file    Inability: Not on file  . Transportation needs    Medical: Not on file    Non-medical: Not on file  Tobacco Use  . Smoking status: Never Smoker  . Smokeless tobacco: Never Used  Substance and Sexual Activity  . Alcohol use: Yes    Comment: occasional  . Drug use: No  . Sexual activity: Yes    Birth control/protection: None  Lifestyle  . Physical activity    Days per week: Not on file    Minutes per session: Not on file  . Stress: Not on file  Relationships  . Social Herbalist on phone: Not on file    Gets together: Not on file    Attends religious service: Not on file    Active  member of club or organization: Not on file    Attends meetings of clubs or organizations: Not on file    Relationship status: Not on file  Other Topics Concern  . Not on file  Social History Narrative  . Not on file   Family History  Problem Relation Age of Onset  . Colon cancer Brother 42  . Pancreatic cancer Brother 69  . Liver cancer Maternal Uncle 87  . Colon cancer Maternal Uncle   . Breast cancer Neg Hx    Allergies  Allergen Reactions  . Hydrocodone Itching  . Morphine And Related Nausea And Vomiting  . Penicillins Other (See Comments)    Did it involve swelling of the face/tongue/throat, SOB, or low BP? No Did it involve sudden or severe rash/hives, skin peeling, or any reaction on the inside of your mouth or nose? Yes Did you need to seek medical attention at a hospital or doctor's office? Yes When did it last happen?in her 64s If all above answers are "NO", may proceed with cephalosporin use.    Prior to Admission medications   Medication Sig Start Date End Date Taking? Authorizing Provider  atorvastatin (LIPITOR) 40 MG tablet Take 40 mg by mouth daily.  03/07/17  Yes [provider]  lisinopril (ZESTRIL) 5 MG tablet Take 5 mg by mouth daily.    Yes [provider]  magnesium oxide (MAG-OX) 400 MG tablet Take 400 mg by mouth daily.   Yes [provider]  metFORMIN (GLUCOPHAGE) 1000 MG tablet Take 1,000 mg by mouth 2 (two) times daily with a meal.    Yes [provider]     Positive ROS: All other systems have been reviewed and were otherwise negative with the exception of those mentioned in the HPI and as above.  Physical Exam: General: Alert, no acute distress Cardiovascular: Regular rate and rhythm, no murmurs rubs or gallops.  No pedal edema Respiratory: Clear to auscultation bilaterally, no wheezes rales or rhonchi. No cyanosis, no use of accessory musculature GI: No organomegaly, abdomen is soft and non-tender  nondistended with positive bowel sounds. Skin: Skin intact, no lesions within the operative field. Neurologic: Sensation intact distally Psychiatric: Patient is competent for consent with normal mood and affect Lymphatic: No cervical lymphadenopathy  MUSCULOSKELETAL: Right shoulder: Patient skin is intact.  There is no erythema ecchymosis swelling, deformity or muscle atrophy.  She had minimal point tenderness over the greater tuberosity.  She had pain at approximately 80 to 90 degrees of forward elevation and abduction.  She had pain and weakness with downward directed force on her abducted shoulder without significant weakness of external rotation.  She had pain with impingement testing but no apprehension or instability.  The patient had full digital wrist and elbow range of motion, intact sensation to light touch throughout the right upper extremity and a palpable radial pulse.  Assessment: M75.121 complete rotator cuff tear or rupture of right shoulder  Plan: Plan for Procedure(s): RIGHT SHOULDER ARTHROSCOPIC SUBACROMIAL DECOMPRESSION, DISTAL CLAVICLE EXCISION WITH MINI-OPEN ROTATOR CUFF REPAIR  I performed an H&P at the bedside this morning.  Patient had an interscalene block with Exparel by the anesthesia service in the preoperative area.  I marked the right shoulder according to the hospitals correct site of surgery protocol.  I reviewed with the patient the surgical plan as well as the postoperative course.  Patient is familiar with this having been through left shoulder rotator cuff repair.  She is also aware of the risks and benefits of surgery.  I discussed the risks and benefits of surgery. The risks include but are not limited to infection, bleeding, nerve or blood vessel injury, joint stiffness or loss of motion, persistent pain, weakness or instability, re-tear of the rotator cuff, Popeye deformity of the biceps, failure of the repair, hardware failure and the need for further  surgery. Medical risks include but are not limited to DVT and pulmonary embolism, myocardial infarction, stroke, pneumonia, respiratory failure and death. Patient understood these risks and wished to proceed.     Thornton Park, MD   04/28/2019 7:51 AM

## 2019-04-28 NOTE — Anesthesia Procedure Notes (Signed)
Anesthesia Regional Block: Interscalene brachial plexus block   Pre-Anesthetic Checklist: ,, timeout performed, Correct Patient, Correct Site, Correct Laterality, Correct Procedure, Correct Position, site marked, Risks and benefits discussed,  Surgical consent,  Pre-op evaluation,  At surgeon's request and post-op pain management  Laterality: Right  Prep: chloraprep       Needles:  Injection technique: Single-shot  Needle Type: Stimiplex     Needle Length: 10cm  Needle Gauge: 21     Additional Needles:   Procedures:,,,, ultrasound used (permanent image in chart),,,,  Narrative:  Start time: 04/28/2019 7:09 AM End time: 04/28/2019 7:14 AM Injection made incrementally with aspirations every 5 mL.  Performed by: Personally  Anesthesiologist: Emmie Niemann, MD  Additional Notes: Functioning IV was confirmed and monitors were applied.  A Stimuplex needle was used. Sterile prep and drape,hand hygiene and sterile gloves were used.  Negative aspiration and negative test dose prior to incremental administration of local anesthetic. The patient tolerated the procedure well.

## 2019-04-28 NOTE — Transfer of Care (Signed)
Immediate Anesthesia Transfer of Care Note  Patient: Pamela Baldwin  Procedure(s) Performed: RIGHT SHOULDER ARTHROSCOPY, SUBACROMIAL DECOMPRESSION, DISTAL CLAVICLE EXCISION, BICEPS TENODESIS, MINI OPEN ROTATOR CUFF REPAIR (Right )  Patient Location: PACU  Anesthesia Type:General  Level of Consciousness: drowsy  Airway & Oxygen Therapy: Patient Spontanous Breathing and Patient connected to face mask oxygen  Post-op Assessment: Report given to RN and Post -op Vital signs reviewed and stable  Post vital signs: Reviewed and stable  Last Vitals:  Vitals Value Taken Time  BP 140/64 04/28/19 1046  Temp 36.3 C 04/28/19 1046  Pulse 99 04/28/19 1048  Resp 17 04/28/19 1048  SpO2 100 % 04/28/19 1048  Vitals shown include unvalidated device data.  Last Pain:  Vitals:   04/28/19 1046  TempSrc:   PainSc: 0-No pain         Complications: No apparent anesthesia complications

## 2019-04-28 NOTE — Anesthesia Postprocedure Evaluation (Signed)
Anesthesia Post Note  Patient: Pamela Baldwin  Procedure(s) Performed: RIGHT SHOULDER ARTHROSCOPY, SUBACROMIAL DECOMPRESSION, DISTAL CLAVICLE EXCISION, BICEPS TENODESIS, MINI OPEN ROTATOR CUFF REPAIR (Right )  Patient location during evaluation: PACU Anesthesia Type: General Level of consciousness: awake and alert and oriented Pain management: pain level controlled Vital Signs Assessment: post-procedure vital signs reviewed and stable Respiratory status: spontaneous breathing, nonlabored ventilation and respiratory function stable Cardiovascular status: blood pressure returned to baseline and stable Postop Assessment: no signs of nausea or vomiting Anesthetic complications: no     Last Vitals:  Vitals:   04/28/19 1132 04/28/19 1217  BP: 131/64 127/65  Pulse: 84 78  Resp: 16 16  Temp: (!) 36.2 C (!) 36.2 C  SpO2: 97% 97%    Last Pain:  Vitals:   04/28/19 1217  TempSrc: Tympanic  PainSc:                  Pamela Baldwin

## 2019-04-28 NOTE — Discharge Instructions (Signed)
AMBULATORY SURGERY  DISCHARGE INSTRUCTIONS   1) The drugs that you were given will stay in your system until tomorrow so for the next 24 hours you should not:  A) Drive an automobile B) Make any legal decisions C) Drink any alcoholic beverage   2) You may resume regular meals tomorrow.  Today it is better to start with liquids and gradually work up to solid foods.  You may eat anything you prefer, but it is better to start with liquids, then soup and crackers, and gradually work up to solid foods.   3) Please notify your doctor immediately if you have any unusual bleeding, trouble breathing, redness and pain at the surgery site, drainage, fever, or pain not relieved by medication.    4) Additional Instructions:        Please contact your physician with any problems or Same Day Surgery at [phone], Monday through Friday 6 am to 4 pm, or [place] at [facility] number at [phone].Interscalene Nerve Block, Care After This sheet gives you information about how to care for yourself after your procedure. Your health care provider may also give you more specific instructions. If you have problems or questions, contact your health care provider. What can I expect after the procedure? After the procedure, it is common to have:  Soreness or tenderness in your neck.  Numbness in your shoulder, upper arm, and some fingers.  Weakness in your shoulder and arm muscles. The feeling and strength in your shoulder, arm, and fingers should return to normal within hours after your procedure. Follow these instructions at home: For at least 24 hours after the procedure:  Do not: ? Participate in activities in which you could fall or become injured. ? Drive. ? Use heavy machinery. ? Drink alcohol. ? Take sleeping pills or medicines that cause drowsiness. ? Make important decisions or sign legal documents. ? Take care of children on your own.  Rest. Eating and drinking  If  you vomit, drink water, juice, or soup when you can drink without vomiting.  Make sure you have little or no nausea before eating solid foods.  Follow the diet that is recommended by your health care provider. If you have a sling:  Wear it as told by your health care provider. Remove it only as told by your health care provider.  Loosen the sling if your fingers tingle, become numb, or turn cold and blue.  Make sure that your entire arm, including your wrist, is supported. Do not allow your wrist to dangle over the end of the sling.  Do not let your sling get wet if it is not waterproof.  Keep the sling clean. Bathing  Do not take baths, swim, or use a hot tub until your health care provider approves.  If you have a nerve block catheter in place, keep the incision site and tubing dry. Injection site care   Wash your hands with soap and water before you change your bandage (dressing). If soap and water are not available, use hand sanitizer.  Change your dressing as told by your health care provider.  Keep your dressing dry.  Check your nerve block injection site every day for signs of infection. Check for: ? Redness, swelling, or pain. ? Fluid or blood. ? Warmth. Activity  Do not perform complex or risky activities while taking prescription pain medicine and until you have fully recovered.  Return to your normal activities as told by your health care provider and as  you can tolerate them. Ask your health care provider what activities are safe for you.  Rest and take it easy. This will help you heal and recover more quickly and fully.  Be very cautious until you have regained strength and sensation. General instructions  Have a responsible adult stay with you until you are awake and alert.  Do not drive or use heavy machinery while taking prescription pain medicine and until you have fully recovered. Ask your health care provider when it is safe to drive.  Take  over-the-counter and prescription medicines only as told by your health care provider.  If you smoke, do not smoke without supervision.  Do not expose your arm or shoulder to very cold or very hot temperatures until you have full feeling back.  If you have a nerve block catheter in place: ? Try to keep the catheter from getting kinked or pinched. ? Avoid pulling or tugging on the catheter.  Keep all follow-up visits as told by your health care provider. This is important. Contact a health care provider if:  You have chills or fever.  You have redness, swelling, or pain around your injection site.  You have fluid or blood coming from the injection site.  The skin around the injection site is warm to the touch.  There is a bad smell coming from your dressing.  You have hoarseness or a drooping or dry eye that lasts more than a few days.  You have pain that is poorly controlled with the block or with pain medicine.  You have numbness, tingling, or weakness in your shoulder or arm that lasts for more than one week. Get help right away if:  You have severe pain.  You lose or do not regain strength and sensation in your arm even after the nerve block medicine has stopped.  You have trouble breathing.  You have a nerve block catheter still in place and you begin to shiver.  You have a nerve block catheter still in place and you are getting more and more numb or weak. This information is not intended to replace advice given to you by your health care provider. Make sure you discuss any questions you have with your health care provider. Document Released: 07/29/2015 Document Revised: 08/09/2017 Document Reviewed: 04/06/2016 Elsevier Patient Education  2020 [employer].

## 2019-04-28 NOTE — Anesthesia Preprocedure Evaluation (Signed)
Anesthesia Evaluation  Patient identified by MRN, date of birth, ID band Patient awake    Reviewed: Allergy & Precautions, NPO status , Patient's Chart, lab work & pertinent test results  History of Anesthesia Complications Negative for: history of anesthetic complications  Airway Mallampati: II  TM Distance: >3 FB Neck ROM: Full    Dental no notable dental hx.    Pulmonary neg pulmonary ROS, neg sleep apnea, neg COPD,    breath sounds clear to auscultation- rhonchi (-) wheezing      Cardiovascular Exercise Tolerance: Good hypertension, (-) CAD, (-) Past MI, (-) Cardiac Stents and (-) CABG  Rhythm:Regular Rate:Normal - Systolic murmurs and - Diastolic murmurs    Neuro/Psych neg Seizures negative neurological ROS  negative psych ROS   GI/Hepatic negative GI ROS, Neg liver ROS,   Endo/Other  diabetes, Oral Hypoglycemic Agents  Renal/GU negative Renal ROS     Musculoskeletal negative musculoskeletal ROS (+)   Abdominal (+) - obese,   Peds  Hematology negative hematology ROS (+)   Anesthesia Other Findings Past Medical History: No date: Diabetes mellitus without complication (HCC) No date: Hypercholesteremia No date: Hypertension   Reproductive/Obstetrics                             Anesthesia Physical Anesthesia Plan  ASA: II  Anesthesia Plan: General   Post-op Pain Management:  Regional for Post-op pain   Induction: Intravenous  PONV Risk Score and Plan: 2 and Ondansetron, Dexamethasone and Midazolam  Airway Management Planned: Oral ETT  Additional Equipment:   Intra-op Plan:   Post-operative Plan: Extubation in OR  Informed Consent: I have reviewed the patients History and Physical, chart, labs and discussed the procedure including the risks, benefits and alternatives for the proposed anesthesia with the patient or authorized representative who has indicated his/her  understanding and acceptance.     Dental advisory given  Plan Discussed with: CRNA and Anesthesiologist  Anesthesia Plan Comments:         Anesthesia Quick Evaluation

## 2019-04-28 NOTE — Anesthesia Post-op Follow-up Note (Signed)
Anesthesia QCDR form completed.        

## 2019-04-28 NOTE — Progress Notes (Signed)
04/28/2019  11:10 AM  PATIENT:  Pamela Baldwin  63 y.o. female  PRE-OPERATIVE DIAGNOSIS:  M75.121 complete rotator cuff tear or rupture of right shoulder, subacromial impingement, acromioclavicular arthrosis, possible biceps tendon tear  POST-OPERATIVE DIAGNOSIS:  right shoulder full-thickness rotator cuff tear with high-grade partial tear of the biceps tendon, subacromial impingement and acromioclavicular joint arthrosis.  PROCEDURE:  Procedure(s): RIGHT SHOULDER ARTHROSCOPIC SUBACROMIAL DECOMPRESSION, DISTAL CLAVICLE EXCISION, BICEPS TENOTOMY AND MINI-OPEN ROTATOR CUFF REPAIR   SURGEON:  Surgeon(s) and Role:    * Thornton Park, MD - Primary  ASSISTANT:  STEPHEN RAMOS, PA  ANESTHESIA:   general and paracervical block   PREOPERATIVE INDICATIONS:  LEXII WALSH is a  63 y.o. female with a diagnosis of M75.121 complete rotator cuff tear or rupture of right shoulder who failed conservative measures and elected for surgical management.    The risks benefits and alternatives were discussed with the patient preoperatively including but not limited to the risks of infection, bleeding, nerve injury, persistent pain or weakness, failure of the hardware, re-tear of the rotator cuff and the need for further surgery. Medical risks include DVT and pulmonary embolism, myocardial infarction, stroke, pneumonia, respiratory failure and death. Patient understood these risks and wished to proceed.  OPERATIVE IMPLANTS: ArthroCare Multifix anchors x 2 & Smith [employer] Nephew Q Fix anchors x 2  OPERATIVE PROCEDURE: The patient was met in the preoperative area.  A preop H&P was performed at the bedside.  Patient had an interscalene block of the right shoulder with Exparel by the anesthesia service in the preoperative area.  The right shoulder was signed with the word yes and my initials according the hospital's correct site of surgery protocol. The patient is brought to the OR and underwent general endotracheal  intubation by the anesthesia service.  The patient was placed in a beachchair position. A spider arm positioner was used for this case. Examination under anesthesia revealed full passive range of motion and no instability to load-and-shift testing.  Patient had a negative sulcus sign.  The patient was prepped and draped in a sterile fashion. A timeout was performed to verify the patient's name, date of birth, medical record number, correct site of surgery and correct procedure to be performed there was also used to verify the patient received antibiotics that all appropriate instruments, implants and radiographs studies were available in the room. Once all in attendance were in agreement case began.  Bony landmarks were drawn out with a surgical marker along with proposed arthroscopy incisions. These were pre-injected with 1% lidocaine plain. An 11 blade was used to establish a posterior portal through which the arthroscope was placed in the glenohumeral joint. A full diagnostic examination of the shoulder was performed. The anterior portal was established under direct visualization with an 18-gauge spinal needle.  A 5.75 mm arthroscopic cannula was placed through the anterior portal.   The intra-articular portion of the biceps tendon was found to have a partial tear involving greater than 50% of the diameter. Therefore the decision was made to perform a tenotomy. An arthroscopic scissor was used to release the biceps tendon off the superior labrum. Patient had evidence of a type II SLAP tear. The arthroscopic shaver was then used to debride the superior labrum.   There was no evidence for  anterior labral tears seen.  Subscapularis tendon was intact. Patient had a full-thickness tear involving the supraspinatus and infraspinatus tendons with retraction to the mid humeral head. There were no loose bodies within  the inferior recess and no evidence of HAGL lesion.  The arthroscope was then placed in the  subacromial space. A lateral portal was then established using an 18-gauge spinal needle for localization.   The greater tuberosity was debrided using a 5.5 mm resector shaver blade to remove all remaining foreign fibers of the rotator cuff.  Debridement was performed until punctate bleeding was seen at the greater tuberosity footprint, which will allow for rotator cuff healing.  Extensive bursitis was encountered and debrided using a 4.0 resector shaver blade and a 90 ArthroCare wand from the lateral portal. A subacromial decompression was also performed using a 5.5 mm resector shaver blade from the lateral portal. The 5.5 mm resector shaver blade was then placed through the anterior portal and distal clavicle excision was performed. Four ArthroCare Perfect Pass sutures were placed in the lateral border of the rotator cuff tear. All arthroscopic instruments were then removed and the mini-open portion of the procedure began.   A saber-type incision was made along the lateral border of the acromion. The deltoid muscle was identified and split in line with its fibers which allowed visualization of the rotator cuff. The Perfect Pass sutures previously placed in the lateral border of the rotator cuff werealso brought out through the deltoid split. Q-Fix anchors were then placed at the articular margin of the humeral head and greater tuberosity. The four suture limbs of both Q Fix anchors were passed medially through the rotator cuff using a first pass suture passer. A 5th perfect pass suture was placed in the lateral border of the rotator cuff.  The Perfect Pass sutures from the lateral border of the rotator cuff were then anchored to thegreater tuberosity of the humeral head using [place] Multifix anchors x 2. These anchors were tensioned to allow for anatomic reduction of the rotator cuff to the greater tuberosity footprint. The medial row repair was then completed using an arthroscopic knot tying  technique with the Q fix anchor sutures. Once all sutures were tied down, arthroscopic images of the double row repair were taken with the arthroscope both externally and arthroscopically fromthe glenohumeral joint  All incisions were copiously irrigated. The deltoid fascia was repaired using a 0 Vicryl suturean interrupted fashion. The subcutaneous tissue of all incisions were closed with a 2-0 Vicryl. Skin closure for the arthroscopic incisions was performed with 4-0 nylon. The skin edges of the saber incision were approximated with a running 4-0 undyed Monocryl.  A dry sterile dressing including Steri-Strips was applied . The patient was placed in an abduction sling, with a Polar Care sleeve.  All sharp, sponge and instrument counts were correct at the conclusion of the case. I was scrubbed and present for the entire case. I spoke with the patient's husband in the post-op consultation room and informed him that the case had been performed without complication and the patient was stable in recovery room.     Timoteo Gaul, MD

## 2019-04-30 NOTE — Op Note (Signed)
04/28/2019  11:10 AM  PATIENT:  Pamela Baldwin  63 y.o. female  PRE-OPERATIVE DIAGNOSIS:  M75.121 complete rotator cuff tear or rupture of right shoulder, subacromial impingement, acromioclavicular arthrosis, possible biceps tendon tear  POST-OPERATIVE DIAGNOSIS:  right shoulder full-thickness rotator cuff tear with high-grade partial tear of the biceps tendon, subacromial impingement and acromioclavicular joint arthrosis.  PROCEDURE:  Procedure(s): RIGHT SHOULDER ARTHROSCOPIC SUBACROMIAL DECOMPRESSION, DISTAL CLAVICLE EXCISION, BICEPS TENOTOMY AND MINI-OPEN ROTATOR CUFF REPAIR   SURGEON:  Surgeon(s) and Role:    * Thornton Park, MD - Primary  ASSISTANT:  STEPHEN RAMOS, PA  ANESTHESIA:   general and paracervical block   PREOPERATIVE INDICATIONS:  ASHTAN [name] is a  63 y.o. female with a diagnosis of M75.121 complete rotator cuff tear or rupture of right shoulder who failed conservative measures and elected for surgical management.    The risks benefits and alternatives were discussed with the patient preoperatively including but not limited to the risks of infection, bleeding, nerve injury, persistent pain or weakness, failure of the hardware, re-tear of the rotator cuff and the need for further surgery. Medical risks include DVT and pulmonary embolism, myocardial infarction, stroke, pneumonia, respiratory failure and death. Patient understood these risks and wished to proceed.  OPERATIVE IMPLANTS: ArthroCare Multifix anchors x 2 & Smith and Nephew Q Fix anchors x 2  OPERATIVE PROCEDURE: The patient was met in the preoperative area.  A preop H&P was performed at the bedside.  Patient had an interscalene block of the right shoulder with Exparel by the anesthesia service in the preoperative area.  The right shoulder was signed with the word yes and my initials according the hospital's correct site of surgery protocol. The patient is brought to the OR and underwent general  endotracheal intubation by the anesthesia service.  The patient was placed in a beachchair position. A spider arm positioner was used for this case. Examination under anesthesia revealed full passive range of motion and no instability to load-and-shift testing.  Patient had a negative sulcus sign.  The patient was prepped and draped in a sterile fashion. A timeout was performed to verify the patient's name, date of birth, medical record number, correct site of surgery and correct procedure to be performed there was also used to verify the patient received antibiotics that all appropriate instruments, implants and radiographs studies were available in the room. Once all in attendance were in agreement case began.  Bony landmarks were drawn out with a surgical marker along with proposed arthroscopy incisions. These were pre-injected with 1% lidocaine plain. An 11 blade was used to establish a posterior portal through which the arthroscope was placed in the glenohumeral joint. A full diagnostic examination of the shoulder was performed. The anterior portal was established under direct visualization with an 18-gauge spinal needle.  A 5.75 mm arthroscopic cannula was placed through the anterior portal.   The intra-articular portion of the biceps tendon was found to have a partial tear involving greater than 50% of the diameter. Therefore the decision was made to perform a tenotomy. An arthroscopic scissor was used to release the biceps tendon off the superior labrum. Patient had evidence of a type II SLAP tear. The arthroscopic shaver was then used to debride the superior labrum.   There was no evidence for  anterior labral tears seen.  Subscapularis tendon was intact. Patient had a full-thickness tear involving the supraspinatus and infraspinatus tendons with retraction to the mid humeral head. There were no loose bodies within  the inferior recess and no evidence of HAGL lesion.  The arthroscope was  then placed in the subacromial space. A lateral portal was then established using an 18-gauge spinal needle for localization.   The greater tuberosity was debrided using a 5.5 mm resector shaver blade to remove all remaining foreign fibers of the rotator cuff.  Debridement was performed until punctate bleeding was seen at the greater tuberosity footprint, which will allow for rotator cuff healing.  Extensive bursitis was encountered and debrided using a 4.0 resector shaver blade and a 90 ArthroCare wand from the lateral portal. A subacromial decompression was also performed using a 5.5 mm resector shaver blade from the lateral portal. The 5.5 mm resector shaver blade was then placed through the anterior portal and distal clavicle excision was performed. Four ArthroCare Perfect Pass sutures were placed in the lateral border of the rotator cuff tear. All arthroscopic instruments were then removed and the mini-open portion of the procedure began.   A saber-type incision was made along the lateral border of the acromion. The deltoid muscle was identified and split in line with its fibers which allowed visualization of the rotator cuff. The Perfect Pass sutures previously placed in the lateral border of the rotator cuff werealso brought out through the deltoid split. Q-Fix anchors were then placed at the articular margin of the humeral head and greater tuberosity. The four suture limbs of both Q Fix anchors were passed medially through the rotator cuff using a first pass suture passer. A 5th perfect pass suture was placed in the lateral border of the rotator cuff.  The Perfect Pass sutures from the lateral border of the rotator cuff were then anchored to thegreater tuberosity of the humeral head using [place] Multifix anchors x 2. These anchors were tensioned to allow for anatomic reduction of the rotator cuff to the greater tuberosity footprint. The medial row repair was then completed using an  arthroscopic knot tying technique with the Q fix anchor sutures. Once all sutures were tied down, arthroscopic images of the double row repair were taken with the arthroscope both externally and arthroscopically fromthe glenohumeral joint  All incisions were copiously irrigated. The deltoid fascia was repaired using a 0 Vicryl suturean interrupted fashion. The subcutaneous tissue of all incisions were closed with a 2-0 Vicryl. Skin closure for the arthroscopic incisions was performed with 4-0 nylon. The skin edges of the saber incision were approximated with a running 4-0 undyed Monocryl.  A dry sterile dressing including Steri-Strips was applied . The patient was placed in an abduction sling, with a Polar Care sleeve.  All sharp, sponge and instrument counts were correct at the conclusion of the case. I was scrubbed and present for the entire case. I spoke with the patient's husband in the post-op consultation room and informed him that the case had been performed without complication and the patient was stable in recovery room.     Timoteo Gaul, MD        Electronically signed by Thornton Park, MD at 04/28/2019 11:17 AM

## 2019-05-06 ENCOUNTER — Ambulatory Visit (INDEPENDENT_AMBULATORY_CARE_PROVIDER_SITE_OTHER): Payer: Federal, State, Local not specified - PPO | Admitting: Vascular Surgery

## 2019-05-06 ENCOUNTER — Encounter: Payer: Self-pay | Admitting: Orthopedic Surgery

## 2019-08-18 ENCOUNTER — Ambulatory Visit: Payer: Federal, State, Local not specified - PPO | Admitting: Obstetrics and Gynecology

## 2019-09-16 ENCOUNTER — Other Ambulatory Visit: Payer: Self-pay | Admitting: Family Medicine

## 2019-09-16 DIAGNOSIS — Z13820 Encounter for screening for osteoporosis: Secondary | ICD-10-CM

## 2019-09-16 DIAGNOSIS — Z1231 Encounter for screening mammogram for malignant neoplasm of breast: Secondary | ICD-10-CM

## 2019-09-24 ENCOUNTER — Ambulatory Visit: Payer: Federal, State, Local not specified - PPO

## 2019-09-24 ENCOUNTER — Inpatient Hospital Stay: Admission: RE | Admit: 2019-09-24 | Payer: Federal, State, Local not specified - PPO | Source: Ambulatory Visit

## 2019-09-29 ENCOUNTER — Ambulatory Visit: Payer: Federal, State, Local not specified - PPO

## 2019-09-29 ENCOUNTER — Inpatient Hospital Stay: Admission: RE | Admit: 2019-09-29 | Payer: Federal, State, Local not specified - PPO | Source: Ambulatory Visit

## 2019-12-16 ENCOUNTER — Other Ambulatory Visit: Payer: Federal, State, Local not specified - PPO

## 2019-12-16 ENCOUNTER — Ambulatory Visit: Payer: Federal, State, Local not specified - PPO

## 2019-12-28 ENCOUNTER — Inpatient Hospital Stay: Admission: RE | Admit: 2019-12-28 | Payer: Federal, State, Local not specified - PPO | Source: Ambulatory Visit

## 2019-12-28 ENCOUNTER — Ambulatory Visit: Payer: Federal, State, Local not specified - PPO

## 2020-01-05 ENCOUNTER — Ambulatory Visit
Admission: RE | Admit: 2020-01-05 | Discharge: 2020-01-05 | Disposition: A | Discharge status: Home / Self Care | Payer: Medicare Other | Source: Ambulatory Visit | Attending: Family Medicine | Admitting: Family Medicine

## 2020-01-05 ENCOUNTER — Other Ambulatory Visit: Payer: Self-pay

## 2020-01-05 DIAGNOSIS — Z1231 Encounter for screening mammogram for malignant neoplasm of breast: Secondary | ICD-10-CM

## 2020-01-05 DIAGNOSIS — Z13820 Encounter for screening for osteoporosis: Secondary | ICD-10-CM | POA: Diagnosis present

## 2020-01-25 ENCOUNTER — Ambulatory Visit: Admission: RE | Admit: 2020-01-25 | Payer: Medicare Other | Source: Ambulatory Visit

## 2020-01-25 ENCOUNTER — Other Ambulatory Visit: Payer: Self-pay

## 2020-01-28 ENCOUNTER — Other Ambulatory Visit: Payer: Self-pay | Admitting: Family Medicine

## 2020-01-28 DIAGNOSIS — E2839 Other primary ovarian failure: Secondary | ICD-10-CM

## 2020-05-12 ENCOUNTER — Other Ambulatory Visit: Payer: Self-pay | Admitting: Family Medicine

## 2020-05-12 DIAGNOSIS — R109 Unspecified abdominal pain: Secondary | ICD-10-CM

## 2020-05-17 ENCOUNTER — Other Ambulatory Visit: Payer: Self-pay

## 2020-05-17 ENCOUNTER — Ambulatory Visit
Admission: RE | Admit: 2020-05-17 | Discharge: 2020-05-17 | Disposition: A | Discharge status: Home / Self Care | Payer: Medicare Other | Source: Ambulatory Visit | Attending: Family Medicine | Admitting: Family Medicine

## 2020-05-17 DIAGNOSIS — R109 Unspecified abdominal pain: Secondary | ICD-10-CM | POA: Diagnosis not present

## 2020-06-14 ENCOUNTER — Other Ambulatory Visit: Payer: Self-pay

## 2020-06-14 ENCOUNTER — Ambulatory Visit (INDEPENDENT_AMBULATORY_CARE_PROVIDER_SITE_OTHER): Payer: Medicare Other | Admitting: Dermatology

## 2020-06-14 DIAGNOSIS — S50811A Abrasion of right forearm, initial encounter: Secondary | ICD-10-CM

## 2020-06-14 DIAGNOSIS — L820 Inflamed seborrheic keratosis: Secondary | ICD-10-CM

## 2020-06-14 DIAGNOSIS — S50812A Abrasion of left forearm, initial encounter: Secondary | ICD-10-CM

## 2020-06-14 DIAGNOSIS — D485 Neoplasm of uncertain behavior of skin: Secondary | ICD-10-CM | POA: Diagnosis not present

## 2020-06-14 DIAGNOSIS — T07XXXA Unspecified multiple injuries, initial encounter: Secondary | ICD-10-CM

## 2020-06-14 NOTE — Patient Instructions (Signed)

## 2020-06-14 NOTE — Progress Notes (Signed)
   Follow-Up Visit   Subjective  Pamela Baldwin is a 64 y.o. female who presents for the following: lesion (L groin - irregular appearing, growing in size. Patient would like lesion checked) and lesion (R forearm - has been present for about 2 months after the trunk of her car fell on it. Patient is concerned and would like it checked).  The following portions of the chart were reviewed this encounter and updated as appropriate:     Review of Systems:  No other skin or systemic complaints except as noted in HPI or Assessment and Plan.  Objective  Well appearing patient in no apparent distress; mood and affect are within normal limits.  A focused examination was performed including the trunk and extremities. Relevant physical exam findings are noted in the Assessment and Plan.  Objective  L lower abdomen: Erythematous keratotic or waxy stuck-on papule   Objective  R upper elbow: 0.4 cm pink firm scaly papule      Objective  B/L forearms: Excoriations- healing  Assessment & Plan  Inflamed seborrheic keratosis L lower abdomen  Destruction of lesion - L lower abdomen  Destruction method: cryotherapy   Informed consent: discussed and consent obtained   Lesion destroyed using liquid nitrogen: Yes   Region frozen until ice ball extended beyond lesion: Yes   Outcome: patient tolerated procedure well with no complications   Post-procedure details: wound care instructions given    Neoplasm of uncertain behavior of skin R upper elbow  Epidermal / dermal shaving  Lesion diameter (cm):  0.4 Informed consent: discussed and consent obtained   Patient was prepped and draped in usual sterile fashion: area prepped with alcohol. Anesthesia: the lesion was anesthetized in a standard fashion   Anesthetic:  1% lidocaine w/ epinephrine 1-100,000 buffered w/ 8.4% NaHCO3 Instrument used: flexible razor blade   Hemostasis achieved with: pressure, aluminum chloride and  electrodesiccation   Outcome: patient tolerated procedure well   Post-procedure details: wound care instructions given   Post-procedure details comment:  Ointment and small bandage applied Additional details:  Post tx defect 0.6 cm   Specimen 1 - Surgical pathology Differential Diagnosis: D48.5 hypertrophic scar vs SCC vs cyst vs other  Check Margins: No 0.4 cm pink firm scaly papule  Hypertrophic scar vrs DF vrs cyst Shave removal today  Multiple excoriations B/L forearms  Healing , from puppy scratches. Recommend Neosporin or Polysporin/bandaid while healing.   Return if symptoms worsen or fail to improve.  Luther Redo, CMA, am acting as scribe for Brendolyn Patty, MD .  Documentation: I have reviewed the above documentation for accuracy and completeness, and I agree with the above.  Brendolyn Patty MD

## 2020-06-20 ENCOUNTER — Telehealth: Payer: Self-pay

## 2020-06-20 NOTE — Telephone Encounter (Signed)
-----   Message from Brendolyn Patty, MD sent at 06/20/2020  9:56 AM EDT ----- Skin , right upper elbow VERRUCA VULGARIS, IRRITATED, BASE INVOLVED  Benign wart- extended deep. Recommend cryotherapy to base.

## 2020-06-20 NOTE — Telephone Encounter (Signed)
Advised patient biopsy on the right upper elbow was a benign wart, but extended deep. Appointment scheduled for 07/11/20 at 1:30pm for LN2 to base.

## 2020-07-11 ENCOUNTER — Other Ambulatory Visit: Payer: Self-pay

## 2020-07-11 ENCOUNTER — Ambulatory Visit (INDEPENDENT_AMBULATORY_CARE_PROVIDER_SITE_OTHER): Payer: Medicare Other | Admitting: Dermatology

## 2020-07-11 DIAGNOSIS — B079 Viral wart, unspecified: Secondary | ICD-10-CM

## 2020-07-11 DIAGNOSIS — L820 Inflamed seborrheic keratosis: Secondary | ICD-10-CM | POA: Diagnosis not present

## 2020-07-11 DIAGNOSIS — D692 Other nonthrombocytopenic purpura: Secondary | ICD-10-CM

## 2020-07-11 NOTE — Progress Notes (Signed)
   Follow-Up Visit   Subjective  Pamela Baldwin is a 64 y.o. female who presents for the following: Follow-up. She has a biopsy proven wart of the right elbow. Here today for LN2 of the base. She also has a spot on her right forearm she would like checked.  The following portions of the chart were reviewed this encounter and updated as appropriate:      Review of Systems:  No other skin or systemic complaints except as noted in HPI or Assessment and Plan.  Objective  Well appearing patient in no apparent distress; mood and affect are within normal limits.  A focused examination was performed including arms. Relevant physical exam findings are noted in the Assessment and Plan.  Objective  R upper elbow: Biopsy site with mild erythema and scale.  Objective  R mid forearm: Firm flesh papule, 2.13mm   Assessment & Plan   Purpura - pt had recent fall - Violaceous macules and patches - Benign - Related to trauma, age, sun damage and/or use of blood thinners - Observe - Can use OTC arnica containing moisturizer such as Dermend Bruise Formula if desired - Call for worsening or other concerns   Viral warts, unspecified type R upper elbow  Biopsy proven wart, extending to the base.  Destruction of lesion - R upper elbow  Destruction method: cryotherapy   Informed consent: discussed and consent obtained   Lesion destroyed using liquid nitrogen: Yes   Region frozen until ice ball extended beyond lesion: Yes   Outcome: patient tolerated procedure well with no complications   Post-procedure details: wound care instructions given    Inflamed seborrheic keratosis R mid forearm  ISK vs Cyst  Destruction of lesion - R mid forearm  Destruction method: cryotherapy   Informed consent: discussed and consent obtained   Lesion destroyed using liquid nitrogen: Yes   Region frozen until ice ball extended beyond lesion: Yes   Outcome: patient tolerated procedure well with no  complications   Post-procedure details: wound care instructions given    Return if symptoms worsen or fail to improve.  IJamesetta Orleans, CMA, am acting as scribe for Brendolyn Patty, MD .  Documentation: I have reviewed the above documentation for accuracy and completeness, and I agree with the above.  Brendolyn Patty MD

## 2020-07-11 NOTE — Patient Instructions (Signed)
Cryotherapy Aftercare  . Wash gently with soap and water everyday.   Marland Kitchen Apply Vaseline and Band-Aid daily until healed.  Viral Warts & Molluscum Contagiosum  Viral warts and molluscum contagiosum are growths of the skin caused by viral infection of the skin. If you have been given the diagnosis of viral warts or molluscum contagiosum there are a few things that you must understand about your condition:  1. There is no guaranteed treatment method available for this condition. 2. Multiple treatments may be required, 3. The treatments may be time consuming and require multiple visits to the dermatology office. 4. The treatment may be expensive. You will be charged each time you come into the office to have the spots treated. 5. The treated areas may develop new lesions further complicating treatment. 6. The treated areas may leave a scar. 7. There is no guarantee that even after multiple treatments that the spots will be successfully treated. 8. These are caused by a viral infection and can be spread to other areas of the skin and to other people by direct contact. Therefore, new spots may occur.

## 2020-10-05 ENCOUNTER — Other Ambulatory Visit: Payer: Self-pay | Admitting: Family Medicine

## 2020-10-05 DIAGNOSIS — Z1231 Encounter for screening mammogram for malignant neoplasm of breast: Secondary | ICD-10-CM

## 2020-10-12 ENCOUNTER — Other Ambulatory Visit: Payer: Self-pay | Admitting: Family Medicine

## 2020-10-12 DIAGNOSIS — Z1231 Encounter for screening mammogram for malignant neoplasm of breast: Secondary | ICD-10-CM

## 2020-10-13 ENCOUNTER — Other Ambulatory Visit: Payer: Self-pay | Admitting: Family Medicine

## 2020-10-13 DIAGNOSIS — Z13820 Encounter for screening for osteoporosis: Secondary | ICD-10-CM

## 2021-01-05 ENCOUNTER — Other Ambulatory Visit: Payer: Self-pay

## 2021-01-05 ENCOUNTER — Ambulatory Visit
Admission: RE | Admit: 2021-01-05 | Discharge: 2021-01-05 | Disposition: A | Discharge status: Home / Self Care | Payer: Medicare Other | Source: Ambulatory Visit | Attending: Family Medicine | Admitting: Family Medicine

## 2021-01-05 DIAGNOSIS — Z1231 Encounter for screening mammogram for malignant neoplasm of breast: Secondary | ICD-10-CM | POA: Diagnosis not present

## 2021-01-09 ENCOUNTER — Other Ambulatory Visit: Payer: Self-pay

## 2021-01-09 ENCOUNTER — Ambulatory Visit
Admission: RE | Admit: 2021-01-09 | Discharge: 2021-01-09 | Disposition: A | Discharge status: Home / Self Care | Payer: Medicare Other | Source: Ambulatory Visit | Attending: Family Medicine | Admitting: Family Medicine

## 2021-01-09 DIAGNOSIS — M85852 Other specified disorders of bone density and structure, left thigh: Secondary | ICD-10-CM | POA: Diagnosis not present

## 2021-01-09 DIAGNOSIS — Z780 Asymptomatic menopausal state: Secondary | ICD-10-CM | POA: Diagnosis not present

## 2021-01-09 DIAGNOSIS — E119 Type 2 diabetes mellitus without complications: Secondary | ICD-10-CM | POA: Insufficient documentation

## 2021-01-09 DIAGNOSIS — Z13820 Encounter for screening for osteoporosis: Secondary | ICD-10-CM

## 2021-01-24 ENCOUNTER — Other Ambulatory Visit: Payer: Self-pay

## 2021-01-24 ENCOUNTER — Ambulatory Visit (INDEPENDENT_AMBULATORY_CARE_PROVIDER_SITE_OTHER): Payer: Medicare Other | Admitting: Dermatology

## 2021-01-24 ENCOUNTER — Encounter: Payer: Self-pay | Admitting: Dermatology

## 2021-01-24 DIAGNOSIS — L820 Inflamed seborrheic keratosis: Secondary | ICD-10-CM

## 2021-01-24 DIAGNOSIS — B078 Other viral warts: Secondary | ICD-10-CM

## 2021-01-24 DIAGNOSIS — L821 Other seborrheic keratosis: Secondary | ICD-10-CM | POA: Diagnosis not present

## 2021-01-24 DIAGNOSIS — L814 Other melanin hyperpigmentation: Secondary | ICD-10-CM

## 2021-01-24 NOTE — Progress Notes (Signed)
   Follow-Up Visit   Subjective  Pamela Baldwin is a 65 y.o. female who presents for the following: Lesion (On the L forearm - has been present for a few days and is itchy).  The following portions of the chart were reviewed this encounter and updated as appropriate:      Review of Systems:  No other skin or systemic complaints except as noted in HPI or Assessment and Plan.  Objective  Well appearing patient in no apparent distress; mood and affect are within normal limits.  A focused examination was performed including the B/L arms. Relevant physical exam findings are noted in the Assessment and Plan.  Objective  L forearm x 1: Erythematous keratotic or waxy stuck-on papule    Objective  Right upper elbow: Biopsy scar clear  Assessment & Plan  Inflamed seborrheic keratosis L forearm x 1  Destruction of lesion - L forearm x 1  Destruction method: cryotherapy   Informed consent: discussed and consent obtained   Timeout:  patient name, date of birth, surgical site, and procedure verified Lesion destroyed using liquid nitrogen: Yes   Region frozen until ice ball extended beyond lesion: Yes   Outcome: patient tolerated procedure well with no complications   Post-procedure details: wound care instructions given    Other viral warts Right upper elbow  Bx-proven wart, resolved post cryotherapy  Seborrheic Keratoses - inframammary  - Stuck-on, waxy, tan-brown papules and/or plaques  - Benign-appearing - Discussed benign etiology and prognosis. - Observe - Call for any changes  Lentigines - Scattered tan macules - Due to sun exposure - Benign-appering, observe - Recommend daily broad spectrum sunscreen SPF 30+ to sun-exposed areas, reapply every 2 hours as needed. - Call for any changes  Return if symptoms worsen or fail to improve.  Luther Redo, CMA, am acting as scribe for Brendolyn Patty, MD .  Documentation: I have reviewed the above documentation for  accuracy and completeness, and I agree with the above.  Brendolyn Patty MD

## 2021-01-24 NOTE — Patient Instructions (Signed)
If you have any questions or concerns for your doctor, please call our main line at [phone] and press option 4 to reach your doctor's medical assistant. If no one answers, please leave a voicemail as directed and we will return your call as soon as possible. Messages left after 4 pm will be answered the following business day.   You may also send [place] a message via [place]. We typically respond to MyChart messages within 1-2 business days.  For prescription refills, please ask your pharmacy to contact our office. Our fax number is [phone].  If you have an urgent issue when the clinic is closed that cannot wait until the next business day, you can page your doctor at the number below.    Please note that while we do our best to be available for urgent issues outside of office hours, we are not available 24/7.   If you have an urgent issue and are unable to reach [place], you may choose to seek medical care at your doctor's office, retail clinic, urgent care center, or emergency room.  If you have a medical emergency, please immediately call 911 or go to the emergency department.  Pager Numbers  - Dr. Nehemiah Massed: [phone]  - Dr. Laurence Ferrari: [phone]  - Dr. Nicole Kindred: [phone]  In the event of inclement weather, please call our main line at [phone] for an update on the status of any delays or closures.  Dermatology Medication Tips: Please keep the boxes that topical medications come in in order to help keep track of the instructions about where and how to use these. Pharmacies typically print the medication instructions only on the boxes and not directly on the medication tubes.   If your medication is too expensive, please contact our office at [phone] option 4 or send [place] a message through [place].   We are unable to tell what your co-pay for medications will be in advance as this is different depending on your insurance coverage. However, we may be able to find a substitute  medication at lower cost or fill out paperwork to get insurance to cover a needed medication.   If a prior authorization is required to get your medication covered by your insurance company, please allow [place] 1-2 business days to complete this process.  Drug prices often vary depending on where the prescription is filled and some pharmacies may offer cheaper prices.  The website www.goodrx.com contains coupons for medications through different pharmacies. The prices here do not account for what the cost may be with help from insurance (it may be cheaper with your insurance), but the website can give you the price if you did not use any insurance.  - You can print the associated coupon and take it with your prescription to the pharmacy.  - You may also stop by our office during regular business hours and pick up a GoodRx coupon card.  - If you need your prescription sent electronically to a different pharmacy, notify our office through [facility] or by phone at [phone] option 4.  Seborrheic Keratosis  What causes seborrheic keratoses? Seborrheic keratoses are harmless, common skin growths that first appear during adult life.  As time goes by, more growths appear.  Some people may develop a large number of them.  Seborrheic keratoses appear on both covered and uncovered body parts.  They are not caused by sunlight.  The tendency to develop seborrheic keratoses can be inherited.  They vary in color from skin-colored to gray,  brown, or even black.  They can be either smooth or have a rough, warty surface.   Seborrheic keratoses are superficial and look as if they were stuck on the skin.  Under the microscope this type of keratosis looks like layers upon layers of skin.  That is why at times the top layer may seem to fall off, but the rest of the growth remains and re-grows.    Treatment Seborrheic keratoses do not need to be treated, but can easily be removed in the office.  Seborrheic  keratoses often cause symptoms when they rub on clothing or jewelry.  Lesions can be in the way of shaving.  If they become inflamed, they can cause itching, soreness, or burning.  Removal of a seborrheic keratosis can be accomplished by freezing, burning, or surgery. If any spot bleeds, scabs, or grows rapidly, please return to have it checked, as these can be an indication of a skin cancer.

## 2021-02-23 ENCOUNTER — Ambulatory Visit (INDEPENDENT_AMBULATORY_CARE_PROVIDER_SITE_OTHER): Payer: Medicare Other

## 2021-02-23 ENCOUNTER — Other Ambulatory Visit: Payer: Self-pay

## 2021-02-23 ENCOUNTER — Encounter: Payer: Self-pay | Admitting: Podiatry

## 2021-02-23 ENCOUNTER — Ambulatory Visit (INDEPENDENT_AMBULATORY_CARE_PROVIDER_SITE_OTHER): Payer: Medicare Other | Admitting: Podiatry

## 2021-02-23 ENCOUNTER — Other Ambulatory Visit: Payer: Self-pay | Admitting: Podiatry

## 2021-02-23 DIAGNOSIS — M778 Other enthesopathies, not elsewhere classified: Secondary | ICD-10-CM | POA: Diagnosis not present

## 2021-02-23 MED ORDER — TRIAMCINOLONE ACETONIDE 40 MG/ML IJ SUSP
20.0000 mg | Freq: Once | INTRAMUSCULAR | Status: AC
  Administered 2021-02-23: 20 mg

## 2021-02-24 NOTE — Progress Notes (Signed)
Subjective:  Patient ID: Pamela Baldwin, female    DOB: [birth year],  MRN: [id number] HPI Chief Complaint  Patient presents with   Foot Pain    Dorsal midfoot right - aching, swelling x several months, can't wear enclosed shoes-too much pressure, seen previously for same issue   New Patient (Initial Visit)    Est pt 2017    65 y.o. female presents with the above complaint.   ROS: Denies fever chills nausea vomiting muscle aches pains calf pain back pain chest pain shortness of breath.  Past Medical History:  Diagnosis Date   Diabetes mellitus without complication ([place])    Hypercholesteremia    Hypertension    Past Surgical History:  Procedure Laterality Date   ABDOMINAL HYSTERECTOMY  2000   BUNIONECTOMY     CATARACT EXTRACTION  2016   CERVICAL DISC SURGERY     ELBOW BURSA SURGERY     KNEE ARTHROSCOPY     SHOULDER ARTHROSCOPY WITH OPEN ROTATOR CUFF REPAIR Right 04/28/2019   Procedure: RIGHT SHOULDER ARTHROSCOPY, SUBACROMIAL DECOMPRESSION, DISTAL CLAVICLE EXCISION, BICEPS TENODESIS, MINI OPEN ROTATOR CUFF REPAIR;  Surgeon: Thornton Park, MD;  Location: [facility] ORS;  Service: Orthopedics;  Laterality: Right;   SHOULDER SURGERY     TONSILLECTOMY      Current Outpatient Medications:    VITAMIN A PO, Take by mouth., Disp: , Rfl:    Zinc Acetate, Oral, (ZINC ACETATE PO), Take by mouth., Disp: , Rfl:    [place] CIDER VINEGAR PO, Take by mouth., Disp: , Rfl:    atorvastatin (LIPITOR) 40 MG tablet, Take 40 mg by mouth daily. , Disp: , Rfl:    Cholecalciferol (VITAMIN D-3) 125 MCG (5000 UT) TABS, Take by mouth., Disp: , Rfl:    lisinopril (ZESTRIL) 2.5 MG tablet, Take 2.5 mg by mouth daily., Disp: , Rfl:    magnesium oxide (MAG-OX) 400 MG tablet, Take 400 mg by mouth daily., Disp: , Rfl:    metFORMIN (GLUCOPHAGE) 1000 MG tablet, Take 1,000 mg by mouth 2 (two) times daily with a meal. , Disp: , Rfl:    Multiple Vitamin (MULTIVITAMIN) tablet, Take 1 tablet by mouth daily., Disp: , Rfl:     Niacin (VITAMIN B-3 PO), Take by mouth., Disp: , Rfl:   Allergies  Allergen Reactions   Hydrocodone Itching   Morphine And Related Nausea And Vomiting   Penicillins Other (See Comments)    Did it involve swelling of the face/tongue/throat, SOB, or low BP? No Did it involve sudden or severe rash/hives, skin peeling, or any reaction on the inside of your mouth or nose? Yes Did you need to seek medical attention at a hospital or doctor's office? Yes When did it last happen?      in her 64s If all above answers are "NO", may proceed with cephalosporin use.    Review of Systems Objective:  There were no vitals filed for this visit.  General: Well developed, nourished, in no acute distress, alert and oriented x3   Dermatological: Skin is warm, dry and supple bilateral. Nails x 10 are well maintained; remaining integument appears unremarkable at this time. There are no open sores, no preulcerative lesions, no rash or signs of infection present.  Vascular: Dorsalis Pedis artery and Posterior Tibial artery pedal pulses are 2/4 bilateral with immedate capillary fill time. Pedal hair growth present. No varicosities and no lower extremity edema present bilateral.   Neruologic: Grossly intact via light touch bilateral. Vibratory intact via tuning fork bilateral. Protective  threshold with Semmes Wienstein monofilament intact to all pedal sites bilateral. Patellar and Achilles deep tendon reflexes 2+ bilateral. No Babinski or clonus noted bilateral.   Musculoskeletal: No gross boney pedal deformities bilateral. No pain, crepitus, or limitation noted with foot and ankle range of motion bilateral. Muscular strength 5/5 in all groups tested bilateral.  Gait: Unassisted, Nonantalgic.    Radiographs:  Radiographs taken today demonstrate osseously mature individual with osteoarthritic changes at the second and third tarsometatarsal joints.  Joint space narrowing dorsal spurring is noted.  Pain on  palpation tarsometatarsal joints with radiating pain of the deep peroneal nerve.  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  Plan: Injected the area today 20 mg Kenalog 5 mg Marcaine point of maximal tenderness across the dorsal aspect of the foot.  Tolerated procedure well.  Follow-up with her in 1 month.     Pamela Baldwin T. [place], [place]

## 2021-04-03 ENCOUNTER — Ambulatory Visit (INDEPENDENT_AMBULATORY_CARE_PROVIDER_SITE_OTHER): Payer: Medicare Other | Admitting: Podiatry

## 2021-04-03 ENCOUNTER — Other Ambulatory Visit: Payer: Self-pay

## 2021-04-03 ENCOUNTER — Encounter: Payer: Self-pay | Admitting: Podiatry

## 2021-04-03 DIAGNOSIS — M778 Other enthesopathies, not elsewhere classified: Secondary | ICD-10-CM | POA: Diagnosis not present

## 2021-04-03 DIAGNOSIS — M19079 Primary osteoarthritis, unspecified ankle and foot: Secondary | ICD-10-CM | POA: Diagnosis not present

## 2021-04-03 NOTE — Addendum Note (Signed)
Addended by: Clovis Riley E on: 04/03/2021 10:00 AM   Modules accepted: Orders

## 2021-04-03 NOTE — Progress Notes (Signed)
She presents today states that the foot is really not doing much better after the injection to the dorsal aspect right.  States that it really was not even that much better the first day or so.  Objective: Vital signs are stable alert and oriented x3.  Pulses are palpable.  She still has pain at the tarsometatarsal joints particularly the second and third tarsometatarsal joints of the right foot.  This exquisitely tender on palpation large pulsatile nodules consistent with exostosis and osteoarthritic changes.  Revisited radiographs today which did demonstrate joint space narrowing of the tarsometatarsal joints and the cuneiform navicular joints.  Assessment: Chronic osteoarthritis midfoot right.  Chronic pain midfoot right.  Plan: At this point I am requesting MRI for evaluation of the midfoot and forefoot.  This will be for differential diagnosis as well as surgical planning.

## 2021-04-18 ENCOUNTER — Other Ambulatory Visit: Payer: Self-pay

## 2021-04-18 ENCOUNTER — Ambulatory Visit
Admission: RE | Admit: 2021-04-18 | Discharge: 2021-04-18 | Disposition: A | Discharge status: Home / Self Care | Payer: Medicare Other | Source: Ambulatory Visit | Attending: Podiatry | Admitting: Podiatry

## 2021-04-18 DIAGNOSIS — M778 Other enthesopathies, not elsewhere classified: Secondary | ICD-10-CM | POA: Diagnosis not present

## 2021-04-18 DIAGNOSIS — M19079 Primary osteoarthritis, unspecified ankle and foot: Secondary | ICD-10-CM | POA: Insufficient documentation

## 2021-05-17 ENCOUNTER — Encounter: Payer: Self-pay | Admitting: Podiatry

## 2021-05-17 ENCOUNTER — Ambulatory Visit (INDEPENDENT_AMBULATORY_CARE_PROVIDER_SITE_OTHER): Payer: Medicare Other | Admitting: Podiatry

## 2021-05-17 ENCOUNTER — Other Ambulatory Visit: Payer: Self-pay

## 2021-05-17 DIAGNOSIS — M778 Other enthesopathies, not elsewhere classified: Secondary | ICD-10-CM | POA: Diagnosis not present

## 2021-05-17 DIAGNOSIS — M898X9 Other specified disorders of bone, unspecified site: Secondary | ICD-10-CM | POA: Diagnosis not present

## 2021-05-17 NOTE — Progress Notes (Signed)
She presents today for follow-up of a painful knot dorsal aspect right foot.  States that is unchanged.  She is here to review her MRI.  Objective: I reviewed her past medical history medications allergies surgery social history and review of systems.  Pulses remain palpable.  Neurologic sensorium is intact Deetjen reflexes are intact muscle strength is normal symmetrical.  Nonpulsatile not dorsal aspect of the tarsometatarsal joints feels consistent with osteoarthritic change.  MRI indicates osteoarthritis tarsometatarsal joints.  Assessment osteoarthritis dorsal spurring neuritis.  Plan: Consented her today for exostectomy dorsal aspect l right foot.  We discussed the possible postop complications which may include but not limited to postop pain bleeding swelling infection recurrence need for further surgery overcorrection under correction loss of digit loss of limb loss of life.  Provide her with information regarding the surgery center anesthesia group and a cam boot follow-up with her in a month for surgery.

## 2021-05-18 ENCOUNTER — Ambulatory Visit: Payer: Medicare Other | Admitting: Podiatry

## 2021-06-07 ENCOUNTER — Other Ambulatory Visit: Payer: Self-pay | Admitting: Podiatry

## 2021-06-07 MED ORDER — ONDANSETRON HCL 4 MG PO TABS: 4.0000 mg | ORAL_TABLET | Freq: Three times a day (TID) | ORAL | 0 refills | Status: AC | PRN

## 2021-06-07 MED ORDER — CLINDAMYCIN HCL 150 MG PO CAPS: 150.0000 mg | ORAL_CAPSULE | Freq: Three times a day (TID) | ORAL | 0 refills | Status: DC

## 2021-06-07 MED ORDER — OXYCODONE-ACETAMINOPHEN 10-325 MG PO TABS: 1.0000 | ORAL_TABLET | Freq: Three times a day (TID) | ORAL | 0 refills | Status: AC | PRN

## 2021-06-09 DIAGNOSIS — M898X9 Other specified disorders of bone, unspecified site: Secondary | ICD-10-CM | POA: Diagnosis not present

## 2021-06-14 ENCOUNTER — Other Ambulatory Visit: Payer: Self-pay

## 2021-06-14 ENCOUNTER — Ambulatory Visit (INDEPENDENT_AMBULATORY_CARE_PROVIDER_SITE_OTHER): Payer: Medicare Other | Admitting: Podiatry

## 2021-06-14 ENCOUNTER — Encounter: Payer: Self-pay | Admitting: Podiatry

## 2021-06-14 ENCOUNTER — Ambulatory Visit (INDEPENDENT_AMBULATORY_CARE_PROVIDER_SITE_OTHER): Payer: Medicare Other

## 2021-06-14 DIAGNOSIS — M778 Other enthesopathies, not elsewhere classified: Secondary | ICD-10-CM

## 2021-06-14 DIAGNOSIS — M898X9 Other specified disorders of bone, unspecified site: Secondary | ICD-10-CM

## 2021-06-14 DIAGNOSIS — Z9889 Other specified postprocedural states: Secondary | ICD-10-CM

## 2021-06-14 NOTE — Progress Notes (Signed)
She presents today with significant pain status post dorsal tarsal exostectomy right foot.  She states that she had nausea and vomiting afterwards and a lot of pain.  Objective: Vital signs are stable oriented x3.  Dry sterile dressing intact was removed demonstrates incision is intact margins well coapted foot feels much better though it is edematous mildly erythematous around the toes but no signs of infection.  Radiographs confirm complete resection dorsal exostosis.  Plan: Discussed etiology pathology and surgical therapies redressed today dressed compressive dressing follow-up with her in 1 week

## 2021-06-21 ENCOUNTER — Ambulatory Visit (INDEPENDENT_AMBULATORY_CARE_PROVIDER_SITE_OTHER): Payer: Medicare Other | Admitting: Podiatry

## 2021-06-21 ENCOUNTER — Other Ambulatory Visit: Payer: Self-pay

## 2021-06-21 ENCOUNTER — Encounter: Payer: Self-pay | Admitting: Podiatry

## 2021-06-21 DIAGNOSIS — M898X9 Other specified disorders of bone, unspecified site: Secondary | ICD-10-CM

## 2021-06-21 DIAGNOSIS — Z9889 Other specified postprocedural states: Secondary | ICD-10-CM

## 2021-06-21 NOTE — Progress Notes (Signed)
She presents today for her 2nd post op visit DOS 06/09/2021 status post dorsal tarsal exostectomy right foot.  She states that she is doing better than she has been, its feeling better.  Objective: Vital signs are stable oriented x3.  Dry sterile dressing intact, incision is intact margins well coapted foot mildly swollen around the toes and forefoot but no signs of infection.   Suture ends removed today and incision remained intact. Foot was dressing with a light dressing with instructions to keep on and dry 24 hours. I dispensed a compression anklet for wear through the day and a surgical shoe. Instructed patient she could get the foot shower wet, but not to soak.   Plan: She will follow up with Dr. Milinda Pointer in 2 weeks.

## 2021-07-05 ENCOUNTER — Ambulatory Visit (INDEPENDENT_AMBULATORY_CARE_PROVIDER_SITE_OTHER): Payer: Medicare Other | Admitting: Podiatry

## 2021-07-05 ENCOUNTER — Encounter: Payer: Self-pay | Admitting: Podiatry

## 2021-07-05 ENCOUNTER — Other Ambulatory Visit: Payer: Self-pay

## 2021-07-05 DIAGNOSIS — M898X9 Other specified disorders of bone, unspecified site: Secondary | ICD-10-CM

## 2021-07-05 DIAGNOSIS — Z9889 Other specified postprocedural states: Secondary | ICD-10-CM

## 2021-07-05 NOTE — Progress Notes (Signed)
Presents today for her third postop visit dorsal tarsal exostectomy right foot states that it feels good she is happy she is wearing regular shoes.  Objective: Pulses are palpable incisions: Healed uneventfully she has some mild tenderness over the dorsal medial aspect of the right foot some numbness along the medial aspect of the foot most likely still from her nerve block.  Assessment well-healing surgical foot.  Plan: She will wear her regular tennis shoe and I will follow-up with her in about 2 weeks we did discuss the possible need for contrast baths for her allodynic pain.

## 2021-07-19 ENCOUNTER — Encounter: Payer: Federal, State, Local not specified - PPO | Admitting: Podiatry

## 2021-12-06 ENCOUNTER — Encounter: Payer: Self-pay | Admitting: Podiatry

## 2021-12-06 ENCOUNTER — Ambulatory Visit (INDEPENDENT_AMBULATORY_CARE_PROVIDER_SITE_OTHER): Payer: Medicare Other

## 2021-12-06 ENCOUNTER — Ambulatory Visit (INDEPENDENT_AMBULATORY_CARE_PROVIDER_SITE_OTHER): Payer: Medicare Other | Admitting: Podiatry

## 2021-12-06 ENCOUNTER — Other Ambulatory Visit: Payer: Self-pay | Admitting: Family Medicine

## 2021-12-06 DIAGNOSIS — M778 Other enthesopathies, not elsewhere classified: Secondary | ICD-10-CM

## 2021-12-06 DIAGNOSIS — S93601A Unspecified sprain of right foot, initial encounter: Secondary | ICD-10-CM

## 2021-12-06 DIAGNOSIS — Z1231 Encounter for screening mammogram for malignant neoplasm of breast: Secondary | ICD-10-CM

## 2021-12-06 MED ORDER — METHYLPREDNISOLONE 4 MG PO TBPK: ORAL_TABLET | ORAL | 0 refills | Status: DC

## 2021-12-06 MED ORDER — MELOXICAM 15 MG PO TABS: 15.0000 mg | ORAL_TABLET | Freq: Every day | ORAL | 0 refills | Status: AC

## 2021-12-06 NOTE — Progress Notes (Signed)
She presents today states that she fail and she feels it feels that she may have injured her right foot.  She has been aching for a few weeks now she says she fell at the beginning of April.  She said that she fell in the foot hit something she tried arthritis cream and ibuprofen but it just seems to be getting worse. ? ?Objective: Vital signs are stable alert oriented x3 there is no erythema edema cellulitis drainage or odor she has pain on palpation fourth fifth tarsometatarsal joint on frontal plane range of motion she has no tenderness on the dorsal tarsal exostectomy site.  Radiographs taken today do not demonstrate any type of osseous abnormalities in this area. ? ?Assessment: Capsulitis or sprain of the fourth fifth TMT joint. ? ?Plan: Discussed etiology pathology and surgical therapies at this point offered her injection and she declined start her on methylprednisolone to be followed by meloxicam. ?

## 2022-01-08 ENCOUNTER — Encounter: Payer: Self-pay | Admitting: Podiatry

## 2022-01-08 ENCOUNTER — Ambulatory Visit (INDEPENDENT_AMBULATORY_CARE_PROVIDER_SITE_OTHER): Payer: Medicare Other | Admitting: Podiatry

## 2022-01-08 DIAGNOSIS — S93601D Unspecified sprain of right foot, subsequent encounter: Secondary | ICD-10-CM | POA: Diagnosis not present

## 2022-01-08 NOTE — Progress Notes (Signed)
She presents today for follow-up of a sprain to the third fourth intermetatarsal space of the left foot.  She states that is feeling better approximately 90% improved.  She states that if it bothers her she takes 800 mg of Motrin and it goes away immediately.  Objective: Vital signs are stable she is alert and oriented x3 there is no reproducible pain though she does have some tenderness on palpation to the third interdigital space of the right foot.  She also has spurring to the dorsal aspect of the foot consistent with dorsal tarsal osteoarthritic change.  Assessment: Resolving sprain to the third and fourth tarsometatarsal joint.  Plan: Follow-up with me on an as-needed basis.

## 2022-01-17 ENCOUNTER — Ambulatory Visit
Admission: RE | Admit: 2022-01-17 | Discharge: 2022-01-17 | Disposition: A | Discharge status: Home / Self Care | Payer: Medicare Other | Source: Ambulatory Visit | Attending: Family Medicine | Admitting: Family Medicine

## 2022-01-17 DIAGNOSIS — Z1231 Encounter for screening mammogram for malignant neoplasm of breast: Secondary | ICD-10-CM | POA: Insufficient documentation

## 2022-01-24 ENCOUNTER — Ambulatory Visit (INDEPENDENT_AMBULATORY_CARE_PROVIDER_SITE_OTHER): Payer: Medicare Other | Admitting: Dermatology

## 2022-01-24 DIAGNOSIS — L814 Other melanin hyperpigmentation: Secondary | ICD-10-CM | POA: Diagnosis not present

## 2022-01-24 DIAGNOSIS — L853 Xerosis cutis: Secondary | ICD-10-CM

## 2022-01-24 DIAGNOSIS — K130 Diseases of lips: Secondary | ICD-10-CM

## 2022-01-24 DIAGNOSIS — L300 Nummular dermatitis: Secondary | ICD-10-CM | POA: Diagnosis not present

## 2022-01-24 DIAGNOSIS — L578 Other skin changes due to chronic exposure to nonionizing radiation: Secondary | ICD-10-CM | POA: Diagnosis not present

## 2022-01-24 DIAGNOSIS — L821 Other seborrheic keratosis: Secondary | ICD-10-CM | POA: Diagnosis not present

## 2022-01-24 MED ORDER — HYDROQUINONE 4 % EX CREA: TOPICAL_CREAM | Freq: Two times a day (BID) | CUTANEOUS | 3 refills | Status: AC

## 2022-01-24 MED ORDER — CLOBETASOL PROPIONATE 0.05 % EX SOLN: 1.0000 "application " | Freq: Two times a day (BID) | CUTANEOUS | 0 refills | Status: DC

## 2022-01-24 MED ORDER — HYDROCORTISONE 2.5 % EX CREA: TOPICAL_CREAM | CUTANEOUS | 1 refills | Status: DC

## 2022-01-24 NOTE — Patient Instructions (Addendum)
Eczema Skin Care  Buy TWO 16oz jars of CeraVe moisturizing cream  [facility], [facility], [facility] (no prescription needed)  Costs about $15 per jar   Jar #1: Use as a moisturizer as needed. Can be applied to any area of the body. Use twice daily to unaffected areas.  Jar #2: Pour one 46m bottle of clobetasol 0.05% solution into jar, mix well. Label this jar to indicate the medication has been added. Use twice daily to affected areas. Do not apply to face, groin or underarms.  Moisturizer may burn or sting initially. Try for at least 4 weeks.     Dry Skin Care  What causes dry skin?  Dry skin is common and results from inadequate moisture in the outer skin layers. Dry skin usually results from the excessive loss of moisture from the skin surface. This occurs due to two major factors: Normally the skin's oil glands deposit a layer of oil on the skin's surface. This layer of oil prevents the loss of moisture from the skin. Exposure to soaps, cleaners, solvents, and disinfectants removes this oily film, allowing water to escape. Water loss from the skin increases when the humidity is low. During winter months we spend a lot of time indoors where the air is heated. Heated air has very low humidity. This also contributes to dry skin.  A tendency for dry skin may accompany such disorders as eczema. Also, as people age, the number of functioning oil glands decreases, and the tendency toward dry skin can be a sensation of skin tightness when emerging from the shower.  How do I manage dry skin?  Humidify your environment. This can be accomplished by using a humidifier in your bedroom at night during winter months. Bathing can actually put moisture back into your skin if done right. Take the following steps while bathing to sooth dry skin: Avoid hot water, which only dries the skin and makes itching worse. Use warm water. Avoid washcloths or extensive rubbing or scrubbing. Use mild soaps like unscented  Dove, Oil of Olay, Cetaphil, Basis, or CeraVe. If you take baths rather than showers, rinse off soap residue with clean water before getting out of tub. Once out of the shower/tub, pat dry gently with a soft towel. Leave your skin damp. While still damp, apply any medicated ointment/cream you were prescribed to the affected areas. After you apply your medicated ointment/cream, then apply your moisturizer to your whole body.This is the most important step in dry skin care. If this is omitted, your skin will continue to be dry. The choice of moisturizer is also very important. In general, lotion will not provider enough moisture to severely dry skin because it is water based. You should use an ointment or cream. Moisturizers should also be unscented. Good choices include Vaseline (plain petrolatum), Aquaphor, Cetaphil, CeraVe, Vanicream, DML Forte, Aveeno moisture, or Eucerin Cream. Bath oils can be helpful, but do not replace the application of moisturizer after the bath. In addition, they make the tub slippery causing an increased risk for falls. Therefore, we do not recommend their use.       Due to recent changes in healthcare laws, you may see results of your pathology and/or laboratory studies on MyChart before the doctors have had a chance to review them. We understand that in some cases there may be results that are confusing or concerning to you. Please understand that not all results are received at the same time and often the doctors may need to interpret  multiple results in order to provide you with the best plan of care or course of treatment. Therefore, we ask that you please give [place] 2 business days to thoroughly review all your results before contacting the office for clarification. Should we see a critical lab result, you will be contacted sooner.   If You Need Anything After Your Visit  If you have any questions or concerns for your doctor, please call our main line at [phone]  and press option 4 to reach your doctor's medical assistant. If no one answers, please leave a voicemail as directed and we will return your call as soon as possible. Messages left after 4 pm will be answered the following business day.   You may also send [place] a message via [place]. We typically respond to MyChart messages within 1-2 business days.  For prescription refills, please ask your pharmacy to contact our office. Our fax number is [phone].  If you have an urgent issue when the clinic is closed that cannot wait until the next business day, you can page your doctor at the number below.    Please note that while we do our best to be available for urgent issues outside of office hours, we are not available 24/7.   If you have an urgent issue and are unable to reach [place], you may choose to seek medical care at your doctor's office, retail clinic, urgent care center, or emergency room.  If you have a medical emergency, please immediately call 911 or go to the emergency department.  Pager Numbers  - Dr. Nehemiah Massed: [phone]  - Dr. Laurence Ferrari: [phone]  - Dr. Nicole Kindred: [phone]  In the event of inclement weather, please call our main line at [phone] for an update on the status of any delays or closures.  Dermatology Medication Tips: Please keep the boxes that topical medications come in in order to help keep track of the instructions about where and how to use these. Pharmacies typically print the medication instructions only on the boxes and not directly on the medication tubes.   If your medication is too expensive, please contact our office at [phone] option 4 or send [place] a message through [place].   We are unable to tell what your co-pay for medications will be in advance as this is different depending on your insurance coverage. However, we may be able to find a substitute medication at lower cost or fill out paperwork to get insurance to cover a needed medication.    If a prior authorization is required to get your medication covered by your insurance company, please allow [place] 1-2 business days to complete this process.  Drug prices often vary depending on where the prescription is filled and some pharmacies may offer cheaper prices.  The website www.goodrx.com contains coupons for medications through different pharmacies. The prices here do not account for what the cost may be with help from insurance (it may be cheaper with your insurance), but the website can give you the price if you did not use any insurance.  - You can print the associated coupon and take it with your prescription to the pharmacy.  - You may also stop by our office during regular business hours and pick up a GoodRx coupon card.  - If you need your prescription sent electronically to a different pharmacy, notify our office through [facility] or by phone at [phone] option 4.     Si Usted Necesita Algo Despus de Su Visita  Tambin puede enviarnos un mensaje a travs de MyChart. Por lo general respondemos a los mensajes de MyChart en el transcurso de 1 a 2 das hbiles.  Para renovar recetas, por favor pida a su farmacia que se ponga en contacto con nuestra oficina. Harland Dingwall de fax es [place] [phone].  Si tiene un asunto urgente cuando la clnica est cerrada y que no puede esperar hasta el siguiente da hbil, puede llamar/localizar a su doctor(a) al nmero que aparece a continuacin.   Por favor, tenga en cuenta que aunque hacemos todo lo posible para estar disponibles para asuntos urgentes fuera del horario de [place], no estamos disponibles las 24 horas del da, los 7 das de la [place].   Si tiene un problema urgente y no puede comunicarse con nosotros, puede optar por buscar atencin mdica  en el consultorio de su doctor(a), en una clnica privada, en un centro de atencin urgente o en una sala de emergencias.  Si tiene Engineering geologist, por favor  llame inmediatamente al 911 o vaya a la sala de emergencias.  Nmeros de bper  - Dr. Nehemiah Massed: [phone]  - Dra. Moye: [phone]  - Dra. Nicole Kindred: [phone]  En caso de inclemencias del [place], por favor llame a Johnsie Kindred principal al [phone] para una actualizacin sobre el [place] de cualquier retraso o cierre.  Consejos para la medicacin en dermatologa: Por favor, guarde las cajas en las que vienen los medicamentos de uso tpico para ayudarle a seguir las instrucciones sobre dnde y cmo usarlos. Las farmacias generalmente imprimen las instrucciones del medicamento slo en las cajas y no directamente en los tubos del [place].   Si su medicamento es muy caro, por favor, pngase en contacto con Zigmund Daniel llamando al [phone] y presione la opcin 4 o envenos un mensaje a travs de Pharmacist, community.   No podemos decirle cul ser su copago por los medicamentos por adelantado ya que esto es diferente dependiendo de la cobertura de su seguro. Sin embargo, es posible que podamos encontrar un medicamento sustituto a Electrical engineer un formulario para que el seguro cubra el medicamento que se considera necesario.   Si se requiere una autorizacin previa para que su compaa de seguros [place] su medicamento, por favor permtanos de 1 a 2 das hbiles para completar este proceso.  Los precios de los medicamentos varan con frecuencia dependiendo del Environmental consultant de dnde se surte la receta y alguna farmacias pueden ofrecer precios ms baratos.  El sitio web www.goodrx.com tiene cupones para medicamentos de Airline pilot. Los precios aqu no tienen en cuenta lo que podra costar con la ayuda del seguro (puede ser ms barato con su seguro), pero el sitio web puede darle el precio si no utiliz Research scientist (physical sciences).  - Puede imprimir el cupn correspondiente y llevarlo con su receta a la farmacia.  - Tambin puede pasar por nuestra oficina durante el horario de atencin regular y  Charity fundraiser una tarjeta de cupones de GoodRx.  - Si necesita que su receta se enve electrnicamente a una farmacia diferente, informe a nuestra oficina a travs de MyChart de [place] o por telfono llamando al [phone] y presione la opcin 4.

## 2022-01-24 NOTE — Progress Notes (Signed)
Follow-Up Visit   Subjective  Pamela Baldwin is a 66 y.o. female who presents for the following: brown spots (Face, has had for years, been using otc Neutragena retinol cream), Pruritis with red spots (Trunk, legs, ~73m itching only on trunk, legs do not itch, pt started TMC 0.1% qd /bid, no new soaps, lotions or laundry detergent, has also ), and check upper lip (Pt feels line a redness above upper lip).  The patient has spots, moles and lesions to be evaluated, some may be new or changing and the patient has concerns that these could be cancer.   The following portions of the chart were reviewed this encounter and updated as appropriate:       Review of Systems:  No other skin or systemic complaints except as noted in HPI or Assessment and Plan.  Objective  Well appearing patient in no apparent distress; mood and affect are within normal limits.  A focused examination was performed including face, trunk, legs. Relevant physical exam findings are noted in the Assessment and Plan.  Right Abdomen (side) - Lower Light pink indistinct scaly patches abdomen  Right Lower Leg - Anterior Xerosis with follicular prominence on legs  face Stuck-on, waxy, tan-brown papules and plaques -- Discussed benign etiology and prognosis.   upper lip Erythema upper lip at vermilion edge    Assessment & Plan   Actinic Damage - chronic, secondary to cumulative UV radiation exposure/sun exposure over time - diffuse scaly erythematous macules with underlying dyspigmentation - Recommend daily broad spectrum sunscreen SPF 30+ to sun-exposed areas, reapply every 2 hours as needed.  - Recommend staying in the shade or wearing long sleeves, sun glasses (UVA+UVB protection) and wide brim hats (4-inch brim around the entire circumference of the hat). - Call for new or changing lesions.  - face  Lentigines - Scattered tan macules - Due to sun exposure - Benign-appering, observe - Recommend daily  broad spectrum sunscreen SPF 30+ to sun-exposed areas, reapply every 2 hours as needed. - Call for any changes  - face Nummular dermatitis Right Abdomen (side) - Lower  Vrs atopic dermatitis Chronic and persistent condition with duration or expected duration over one year. Condition is bothersome/symptomatic for patient. Currently flared.   Atopic dermatitis (eczema) is a chronic, relapsing, pruritic condition that can significantly affect quality of life. It is often associated with allergic rhinitis and/or asthma and can require treatment with topical medications, phototherapy, or in severe cases biologic injectable medication (Dupixent; Adbry) or Oral JAK inhibitors.   Start Clobetasol/Cerave cr bid to aa itchy rash until clear, then prn flares, avoid f/g/a  If not improving, consider biopsy  Topical steroids (such as triamcinolone, fluocinolone, fluocinonide, mometasone, clobetasol, halobetasol, betamethasone, hydrocortisone) can cause thinning and lightening of the skin if they are used for too long in the same area. Your physician has selected the right strength medicine for your problem and area affected on the body. Please use your medication only as directed by your physician to prevent side effects.    clobetasol (TEMOVATE) 0.05 % external solution - Right Abdomen (side) - Lower Apply 1 application. topically 2 (two) times daily. Mix 552min a tub of cerave cream and use bid to aa itchy rash on trunk and legs until clear, avoid face, groin, axilla  Xerosis cutis Right Lower Leg - Anterior  Cont Dove soap q shower Start Clobetasol/cerave cr bid to aa until clear Then use Cerave cream as a moisturizer qd to whole body  Seborrheic keratosis face  Discussed txt options topical fade cream vs LN2 vs BBL  Discussed cosmetic procedure, noncovered.  $60 for 1st lesion and $15 for each additional lesion if done on the same day.  Maximum charge $350.  One touch-up treatment included no  charge. Discussed risks of treatment including dyspigmentation, small scar, and/or recurrence. Recommend daily broad spectrum sunscreen SPF 30+/photoprotection to treated areas once healed.   Pt defers cryotherapy Start Hydroquinone 4% cr bid to dark spots on face Spf 30+ qam to face  hydroquinone 4 % cream - face Apply topically 2 (two) times daily. Bid to dark spots on face  Cheilitis upper lip  Start HC 2.5% cr qd/bid until clear, then prn flares  Use vaseline to lips prn during day     hydrocortisone 2.5 % cream - upper lip Apply topically as directed. Qd to bid aa upper lip until clear, then prn flares   Return in about 1 month (around 02/23/2022) for Nummular derm.  I, Othelia Pulling, RMA, am acting as scribe for Brendolyn Patty, MD .  Documentation: I have reviewed the above documentation for accuracy and completeness, and I agree with the above.  Brendolyn Patty MD

## 2022-02-26 ENCOUNTER — Ambulatory Visit (INDEPENDENT_AMBULATORY_CARE_PROVIDER_SITE_OTHER): Payer: Medicare Other | Admitting: Dermatology

## 2022-02-26 DIAGNOSIS — L300 Nummular dermatitis: Secondary | ICD-10-CM | POA: Diagnosis not present

## 2022-02-26 DIAGNOSIS — L720 Epidermal cyst: Secondary | ICD-10-CM

## 2022-02-26 DIAGNOSIS — L821 Other seborrheic keratosis: Secondary | ICD-10-CM | POA: Diagnosis not present

## 2022-02-26 DIAGNOSIS — L820 Inflamed seborrheic keratosis: Secondary | ICD-10-CM | POA: Diagnosis not present

## 2022-02-26 MED ORDER — CLOBETASOL PROPIONATE 0.05 % EX SOLN: 1.0000 | Freq: Two times a day (BID) | CUTANEOUS | 0 refills | Status: AC

## 2022-02-26 NOTE — Patient Instructions (Addendum)
Aklief cream Apply to white bump on right jaw nightly, tiny amount  Cryotherapy Aftercare  Wash gently with soap and water everyday.   Apply Vaseline and Band-Aid daily until healed.   Due to recent changes in healthcare laws, you may see results of your pathology and/or laboratory studies on MyChart before the doctors have had a chance to review them. We understand that in some cases there may be results that are confusing or concerning to you. Please understand that not all results are received at the same time and often the doctors may need to interpret multiple results in order to provide you with the best plan of care or course of treatment. Therefore, we ask that you please give [place] 2 business days to thoroughly review all your results before contacting the office for clarification. Should we see a critical lab result, you will be contacted sooner.   If You Need Anything After Your Visit  If you have any questions or concerns for your doctor, please call our main line at [phone] and press option 4 to reach your doctor's medical assistant. If no one answers, please leave a voicemail as directed and we will return your call as soon as possible. Messages left after 4 pm will be answered the following business day.   You may also send [place] a message via [place]. We typically respond to MyChart messages within 1-2 business days.  For prescription refills, please ask your pharmacy to contact our office. Our fax number is [phone].  If you have an urgent issue when the clinic is closed that cannot wait until the next business day, you can page your doctor at the number below.    Please note that while we do our best to be available for urgent issues outside of office hours, we are not available 24/7.   If you have an urgent issue and are unable to reach [place], you may choose to seek medical care at your doctor's office, retail clinic, urgent care center, or emergency room.  If you have a  medical emergency, please immediately call 911 or go to the emergency department.  Pager Numbers  - Dr. Nehemiah Massed: [phone]  - Dr. Laurence Ferrari: [phone]  - Dr. Nicole Kindred: [phone]  In the event of inclement weather, please call our main line at [phone] for an update on the status of any delays or closures.  Dermatology Medication Tips: Please keep the boxes that topical medications come in in order to help keep track of the instructions about where and how to use these. Pharmacies typically print the medication instructions only on the boxes and not directly on the medication tubes.   If your medication is too expensive, please contact our office at [phone] option 4 or send [place] a message through [place].   We are unable to tell what your co-pay for medications will be in advance as this is different depending on your insurance coverage. However, we may be able to find a substitute medication at lower cost or fill out paperwork to get insurance to cover a needed medication.   If a prior authorization is required to get your medication covered by your insurance company, please allow [place] 1-2 business days to complete this process.  Drug prices often vary depending on where the prescription is filled and some pharmacies may offer cheaper prices.  The website www.goodrx.com contains coupons for medications through different pharmacies. The prices here do not account for what the cost may be with help from insurance (  it may be cheaper with your insurance), but the website can give you the price if you did not use any insurance.  - You can print the associated coupon and take it with your prescription to the pharmacy.  - You may also stop by our office during regular business hours and pick up a GoodRx coupon card.  - If you need your prescription sent electronically to a different pharmacy, notify our office through [facility] or by phone at [phone] option 4.     Si  Usted Necesita Algo Despus de Su Visita  Tambin puede enviarnos un mensaje a travs de Pharmacist, community. Por lo general respondemos a los mensajes de MyChart en el transcurso de 1 a 2 das hbiles.  Para renovar recetas, por favor pida a su farmacia que se ponga en contacto con nuestra oficina. Harland Dingwall de fax es [place] [phone].  Si tiene un asunto urgente cuando la clnica est cerrada y que no puede esperar hasta el siguiente da hbil, puede llamar/localizar a su doctor(a) al nmero que aparece a continuacin.   Por favor, tenga en cuenta que aunque hacemos todo lo posible para estar disponibles para asuntos urgentes fuera del horario de [place], no estamos disponibles las 24 horas del da, los 7 das de la [place].   Si tiene un problema urgente y no puede comunicarse con nosotros, puede optar por buscar atencin mdica  en el consultorio de su doctor(a), en una clnica privada, en un centro de atencin urgente o en una sala de emergencias.  Si tiene Engineering geologist, por favor llame inmediatamente al 911 o vaya a la sala de emergencias.  Nmeros de bper  - Dr. Nehemiah Massed: [phone]  - Dra. Moye: [phone]  - Dra. Nicole Kindred: [phone]  En caso de inclemencias del [place], por favor llame a Johnsie Kindred principal al [phone] para una actualizacin sobre el [place] de cualquier retraso o cierre.  Consejos para la medicacin en dermatologa: Por favor, guarde las cajas en las que vienen los medicamentos de uso tpico para ayudarle a seguir las instrucciones sobre dnde y cmo usarlos. Las farmacias generalmente imprimen las instrucciones del medicamento slo en las cajas y no directamente en los tubos del [place].   Si su medicamento es muy caro, por favor, pngase en contacto con Zigmund Daniel llamando al [phone] y presione la opcin 4 o envenos un mensaje a travs de Pharmacist, community.   No podemos decirle cul ser su copago por los medicamentos por adelantado ya que  esto es diferente dependiendo de la cobertura de su seguro. Sin embargo, es posible que podamos encontrar un medicamento sustituto a Electrical engineer un formulario para que el seguro cubra el medicamento que se considera necesario.   Si se requiere una autorizacin previa para que su compaa de seguros [place] su medicamento, por favor permtanos de 1 a 2 das hbiles para completar este proceso.  Los precios de los medicamentos varan con frecuencia dependiendo del Environmental consultant de dnde se surte la receta y alguna farmacias pueden ofrecer precios ms baratos.  El sitio web www.goodrx.com tiene cupones para medicamentos de Airline pilot. Los precios aqu no tienen en cuenta lo que podra costar con la ayuda del seguro (puede ser ms barato con su seguro), pero el sitio web puede darle el precio si no utiliz Research scientist (physical sciences).  - Puede imprimir el cupn correspondiente y llevarlo con su receta a la farmacia.  - Tambin puede pasar por nuestra oficina durante el horario de atencin  regular y recoger una tarjeta de cupones de GoodRx.  - Si necesita que su receta se enve electrnicamente a una farmacia diferente, informe a nuestra oficina a travs de MyChart de [place] o por telfono llamando al [phone] y presione la opcin 4.

## 2022-02-26 NOTE — Progress Notes (Signed)
   Follow-Up Visit   Subjective  Pamela Baldwin is a 66 y.o. female who presents for the following: Nummular derm (Abdomen, Clobetasol/cerave bid, improved) and check spots (L jaw, yrs, wast txted with LN2 yrs ago, itchy/R jaw, few months, itchy/R post thigh, few months, itchy).   The following portions of the chart were reviewed this encounter and updated as appropriate:       Review of Systems:  No other skin or systemic complaints except as noted in HPI or Assessment and Plan.  Objective  Well appearing patient in no apparent distress; mood and affect are within normal limits.  A focused examination was performed including abdomen. Relevant physical exam findings are noted in the Assessment and Plan.  R lower abdomen Abdomen clear  L jaw x 1, L buttocks x 1, R post thighx 1 (3) Stuck on waxy paps with erythema  R jaw Smooth white papule(s).   Head - Anterior (Face) Stuck-on, waxy, tan-brown macules/patches, pt feels like fade cream is working, spots not as dark     Assessment & Plan  Nummular dermatitis R lower abdomen  Resolved D/C Clobetasol/cerave mix, may restart prn flares Recommend mild soap and moisturizing cream 1-2 times daily.  Gentle skin care handout provided.  Recommend Cerave cream qd   Related Medications clobetasol (TEMOVATE) 0.05 % external solution Apply 1 Application topically 2 (two) times daily. Mix 35m in a tub of cerave cream and use bid to aa itchy rash on trunk and legs until clear, avoid face, groin, axilla  Inflamed seborrheic keratosis (3) L jaw x 1, L buttocks x 1, R post thighx 1  Symptomatic, irritating, patient would like treated.   Destruction of lesion - L jaw x 1, L buttocks x 1, R post thighx 1  Destruction method: cryotherapy   Informed consent: discussed and consent obtained   Lesion destroyed using liquid nitrogen: Yes   Region frozen until ice ball extended beyond lesion: Yes   Outcome: patient tolerated procedure  well with no complications   Post-procedure details: wound care instructions given   Additional details:  Prior to procedure, discussed risks of blister formation, small wound, skin dyspigmentation, or rare scar following cryotherapy. Recommend Vaseline ointment to treated areas while healing.   Milia R jaw  Benign, observe.    Start Aklief cr qhs to aa, sample x 1 Lot [id number] 08/2023  Seborrheic keratosis Head - Anterior (Face)  Some fading Cont Hydroquinone 4% qhs to aa face Recommend daily broad spectrum sunscreen SPF 30+ to sun-exposed areas, reapply every 2 hours as needed. Call for new or changing lesions.  Staying in the shade or wearing long sleeves, sun glasses (UVA+UVB protection) and wide brim hats (4-inch brim around the entire circumference of the hat) are also recommended for sun protection.    Related Medications hydroquinone 4 % cream Apply topically 2 (two) times daily. Bid to dark spots on face   Return if symptoms worsen or fail to improve.  I, SOthelia Pulling RMA, am acting as scribe for TBrendolyn Patty MD .  Documentation: I have reviewed the above documentation for accuracy and completeness, and I agree with the above.  TBrendolyn PattyMD

## 2022-02-27 ENCOUNTER — Encounter: Payer: Self-pay | Admitting: Dermatology

## 2022-04-04 ENCOUNTER — Encounter: Payer: Self-pay | Admitting: Dermatology

## 2022-04-04 ENCOUNTER — Ambulatory Visit (INDEPENDENT_AMBULATORY_CARE_PROVIDER_SITE_OTHER): Payer: Medicare Other | Admitting: Dermatology

## 2022-04-04 DIAGNOSIS — D2372 Other benign neoplasm of skin of left lower limb, including hip: Secondary | ICD-10-CM

## 2022-04-04 DIAGNOSIS — L820 Inflamed seborrheic keratosis: Secondary | ICD-10-CM

## 2022-04-04 DIAGNOSIS — D239 Other benign neoplasm of skin, unspecified: Secondary | ICD-10-CM

## 2022-04-04 NOTE — Progress Notes (Signed)
   Follow-Up Visit   Subjective  Pamela Baldwin is a 66 y.o. female who presents for the following: Seborrheic Keratosis (Right thigh. Raised, itching. Area on left jaw line is still itching. Tx with LN2).  The patient has spots, moles and lesions to be evaluated, some may be new or changing and the patient has concerns that these could be cancer.   The following portions of the chart were reviewed this encounter and updated as appropriate:      Review of Systems: No other skin or systemic complaints except as noted in HPI or Assessment and Plan.   Objective  Well appearing patient in no apparent distress; mood and affect are within normal limits.  A focused examination was performed including face, right thigh. Relevant physical exam findings are noted in the Assessment and Plan.  Right Upper Anterior Thigh  x2, left anterior  jaw line (residual) x1 (3) Erythematous keratotic or waxy stuck-on papule  Left Knee - Medial 2 mm blue gray papule   Assessment & Plan  Inflamed seborrheic keratosis (3) Right Upper Anterior Thigh  x2, left anterior  jaw line (residual) x1  Symptomatic, irritating, patient would like treated.  Destruction of lesion - Right Upper Anterior Thigh  x2, left anterior  jaw line (residual) x1  Destruction method: cryotherapy   Informed consent: discussed and consent obtained   Lesion destroyed using liquid nitrogen: Yes   Region frozen until ice ball extended beyond lesion: Yes   Outcome: patient tolerated procedure well with no complications   Post-procedure details: wound care instructions given   Additional details:  Prior to procedure, discussed risks of blister formation, small wound, skin dyspigmentation, or rare scar following cryotherapy. Recommend Vaseline ointment to treated areas while healing.   Blue nevus Left Knee - Medial  Vs pencil tattoo  Patient states has been there >50 years  Benign-appearing.  Observation.  Call clinic for  new or changing moles.  Recommend daily use of broad spectrum spf 30+ sunscreen to sun-exposed areas.     Return if symptoms worsen or fail to improve.  I, Emelia Salisbury, CMA, am acting as scribe for Brendolyn Patty, MD.  Documentation: I have reviewed the above documentation for accuracy and completeness, and I agree with the above.  Brendolyn Patty MD

## 2022-04-04 NOTE — Patient Instructions (Signed)
Cryotherapy Aftercare  Wash gently with soap and water everyday.   Apply Vaseline daily until healed.    Due to recent changes in healthcare laws, you may see results of your pathology and/or laboratory studies on MyChart before the doctors have had a chance to review them. We understand that in some cases there may be results that are confusing or concerning to you. Please understand that not all results are received at the same time and often the doctors may need to interpret multiple results in order to provide you with the best plan of care or course of treatment. Therefore, we ask that you please give us 2 business days to thoroughly review all your results before contacting the office for clarification. Should we see a critical lab result, you will be contacted sooner.   If You Need Anything After Your Visit  If you have any questions or concerns for your doctor, please call our main line at [phone] and press option 4 to reach your doctor's medical assistant. If no one answers, please leave a voicemail as directed and we will return your call as soon as possible. Messages left after 4 pm will be answered the following business day.   You may also send us a message via MyChart. We typically respond to MyChart messages within 1-2 business days.  For prescription refills, please ask your pharmacy to contact our office. Our fax number is [phone].  If you have an urgent issue when the clinic is closed that cannot wait until the next business day, you can page your doctor at the number below.    Please note that while we do our best to be available for urgent issues outside of office hours, we are not available 24/7.   If you have an urgent issue and are unable to reach us, you may choose to seek medical care at your doctor's office, retail clinic, urgent care center, or emergency room.  If you have a medical emergency, please immediately call 911 or go to the emergency  department.  Pager Numbers  - Dr. Kowalski: [phone]  - Dr. Moye: [phone]  - Dr. Stewart: [phone]  In the event of inclement weather, please call our main line at [phone] for an update on the status of any delays or closures.  Dermatology Medication Tips: Please keep the boxes that topical medications come in in order to help keep track of the instructions about where and how to use these. Pharmacies typically print the medication instructions only on the boxes and not directly on the medication tubes.   If your medication is too expensive, please contact our office at [phone] option 4 or send us a message through MyChart.   We are unable to tell what your co-pay for medications will be in advance as this is different depending on your insurance coverage. However, we may be able to find a substitute medication at lower cost or fill out paperwork to get insurance to cover a needed medication.   If a prior authorization is required to get your medication covered by your insurance company, please allow us 1-2 business days to complete this process.  Drug prices often vary depending on where the prescription is filled and some pharmacies may offer cheaper prices.  The website www.goodrx.com contains coupons for medications through different pharmacies. The prices here do not account for what the cost may be with help from insurance (it may be cheaper with your insurance), but the website can give you the   price if you did not use any insurance.  - You can print the associated coupon and take it with your prescription to the pharmacy.  - You may also stop by our office during regular business hours and pick up a GoodRx coupon card.  - If you need your prescription sent electronically to a different pharmacy, notify our office through [facility] MyChart or by phone at [phone] option 4.     Si Usted Necesita Algo Despus de Su Visita  Tambin puede enviarnos un  mensaje a travs de MyChart. Por lo general respondemos a los mensajes de MyChart en el transcurso de 1 a 2 das hbiles.  Para renovar recetas, por favor pida a su farmacia que se ponga en contacto con nuestra oficina. Nuestro nmero de fax es el [phone].  Si tiene un asunto urgente cuando la clnica est cerrada y que no puede esperar hasta el siguiente da hbil, puede llamar/localizar a su doctor(a) al nmero que aparece a continuacin.   Por favor, tenga en cuenta que aunque hacemos todo lo posible para estar disponibles para asuntos urgentes fuera del horario de oficina, no estamos disponibles las 24 horas del da, los 7 das de la semana.   Si tiene un problema urgente y no puede comunicarse con nosotros, puede optar por buscar atencin mdica  en el consultorio de su doctor(a), en una clnica privada, en un centro de atencin urgente o en una sala de emergencias.  Si tiene una emergencia mdica, por favor llame inmediatamente al 911 o vaya a la sala de emergencias.  Nmeros de bper  - Dr. Kowalski: [phone]  - Dra. Moye: [phone]  - Dra. Stewart: [phone]  En caso de inclemencias del tiempo, por favor llame a nuestra lnea principal al [phone] para una actualizacin sobre el estado de cualquier retraso o cierre.  Consejos para la medicacin en dermatologa: Por favor, guarde las cajas en las que vienen los medicamentos de uso tpico para ayudarle a seguir las instrucciones sobre dnde y cmo usarlos. Las farmacias generalmente imprimen las instrucciones del medicamento slo en las cajas y no directamente en los tubos del medicamento.   Si su medicamento es muy caro, por favor, pngase en contacto con nuestra oficina llamando al [phone] y presione la opcin 4 o envenos un mensaje a travs de MyChart.   No podemos decirle cul ser su copago por los medicamentos por adelantado ya que esto es diferente dependiendo de la cobertura de su seguro. Sin embargo,  es posible que podamos encontrar un medicamento sustituto a menor costo o llenar un formulario para que el seguro cubra el medicamento que se considera necesario.   Si se requiere una autorizacin previa para que su compaa de seguros cubra su medicamento, por favor permtanos de 1 a 2 das hbiles para completar este proceso.  Los precios de los medicamentos varan con frecuencia dependiendo del lugar de dnde se surte la receta y alguna farmacias pueden ofrecer precios ms baratos.  El sitio web www.goodrx.com tiene cupones para medicamentos de diferentes farmacias. Los precios aqu no tienen en cuenta lo que podra costar con la ayuda del seguro (puede ser ms barato con su seguro), pero el sitio web puede darle el precio si no utiliz ningn seguro.  - Puede imprimir el cupn correspondiente y llevarlo con su receta a la farmacia.  - Tambin puede pasar por nuestra oficina durante el horario de atencin regular y recoger una tarjeta de cupones de GoodRx.  - Si necesita que   su receta se enve electrnicamente a una farmacia diferente, informe a nuestra oficina a travs de MyChart de [place] o por telfono llamando al [phone] y presione la opcin 4.  

## 2022-12-04 ENCOUNTER — Other Ambulatory Visit: Payer: Self-pay | Admitting: Family Medicine

## 2022-12-04 DIAGNOSIS — Z1231 Encounter for screening mammogram for malignant neoplasm of breast: Secondary | ICD-10-CM

## 2023-01-21 ENCOUNTER — Ambulatory Visit
Admission: RE | Admit: 2023-01-21 | Discharge: 2023-01-21 | Disposition: A | Discharge status: Home / Self Care | Payer: Medicare Other | Source: Ambulatory Visit | Attending: Family Medicine | Admitting: Family Medicine

## 2023-01-21 DIAGNOSIS — Z1231 Encounter for screening mammogram for malignant neoplasm of breast: Secondary | ICD-10-CM | POA: Diagnosis present

## 2023-03-04 ENCOUNTER — Other Ambulatory Visit: Payer: Self-pay

## 2023-03-04 DIAGNOSIS — K130 Diseases of lips: Secondary | ICD-10-CM

## 2023-03-04 MED ORDER — HYDROCORTISONE 2.5 % EX CREA: TOPICAL_CREAM | CUTANEOUS | 1 refills | Status: AC

## 2023-03-04 NOTE — Progress Notes (Signed)
HC to [facility]

## 2023-03-18 ENCOUNTER — Encounter: Payer: Self-pay | Admitting: Gastroenterology

## 2023-04-01 ENCOUNTER — Ambulatory Visit: Payer: Medicare Other | Admitting: Anesthesiology

## 2023-04-01 ENCOUNTER — Encounter: Payer: Self-pay | Admitting: Gastroenterology

## 2023-04-01 ENCOUNTER — Encounter
Admission: RE | Disposition: A | Discharge status: Home / Self Care | Payer: Self-pay | Source: Home / Self Care | Attending: Gastroenterology

## 2023-04-01 ENCOUNTER — Other Ambulatory Visit: Payer: Self-pay

## 2023-04-01 ENCOUNTER — Ambulatory Visit
Admission: RE | Admit: 2023-04-01 | Discharge: 2023-04-01 | Disposition: A | Discharge status: Home / Self Care | Payer: Medicare Other | Attending: Gastroenterology | Admitting: Gastroenterology

## 2023-04-01 DIAGNOSIS — D123 Benign neoplasm of transverse colon: Secondary | ICD-10-CM | POA: Diagnosis not present

## 2023-04-01 DIAGNOSIS — I10 Essential (primary) hypertension: Secondary | ICD-10-CM | POA: Diagnosis not present

## 2023-04-01 DIAGNOSIS — Z09 Encounter for follow-up examination after completed treatment for conditions other than malignant neoplasm: Secondary | ICD-10-CM | POA: Insufficient documentation

## 2023-04-01 DIAGNOSIS — K644 Residual hemorrhoidal skin tags: Secondary | ICD-10-CM | POA: Insufficient documentation

## 2023-04-01 DIAGNOSIS — E7800 Pure hypercholesterolemia, unspecified: Secondary | ICD-10-CM | POA: Diagnosis not present

## 2023-04-01 DIAGNOSIS — Z86010 Personal history of colonic polyps: Secondary | ICD-10-CM | POA: Insufficient documentation

## 2023-04-01 DIAGNOSIS — Z800 Family history of malignant neoplasm of digestive organs: Secondary | ICD-10-CM | POA: Insufficient documentation

## 2023-04-01 DIAGNOSIS — D122 Benign neoplasm of ascending colon: Secondary | ICD-10-CM | POA: Diagnosis not present

## 2023-04-01 DIAGNOSIS — Z1211 Encounter for screening for malignant neoplasm of colon: Secondary | ICD-10-CM | POA: Insufficient documentation

## 2023-04-01 DIAGNOSIS — K640 First degree hemorrhoids: Secondary | ICD-10-CM | POA: Insufficient documentation

## 2023-04-01 DIAGNOSIS — Z7984 Long term (current) use of oral hypoglycemic drugs: Secondary | ICD-10-CM | POA: Diagnosis not present

## 2023-04-01 DIAGNOSIS — E119 Type 2 diabetes mellitus without complications: Secondary | ICD-10-CM | POA: Insufficient documentation

## 2023-04-01 DIAGNOSIS — K5730 Diverticulosis of large intestine without perforation or abscess without bleeding: Secondary | ICD-10-CM | POA: Diagnosis not present

## 2023-04-01 DIAGNOSIS — D124 Benign neoplasm of descending colon: Secondary | ICD-10-CM | POA: Diagnosis not present

## 2023-04-01 HISTORY — PX: COLONOSCOPY WITH PROPOFOL: SHX5780

## 2023-04-01 HISTORY — DX: Personal history of other infectious and parasitic diseases: Z86.19

## 2023-04-01 HISTORY — DX: Scarlet fever, uncomplicated: A38.9

## 2023-04-01 HISTORY — PX: POLYPECTOMY: SHX5525

## 2023-04-01 LAB — GLUCOSE, CAPILLARY: Glucose-Capillary: 150 mg/dL — ABNORMAL HIGH (ref 70–99)

## 2023-04-01 SURGERY — COLONOSCOPY WITH PROPOFOL
Anesthesia: General

## 2023-04-01 MED ORDER — PROPOFOL 1000 MG/100ML IV EMUL
INTRAVENOUS | Status: AC
  Filled 2023-04-01: qty 100

## 2023-04-01 MED ORDER — PROPOFOL 500 MG/50ML IV EMUL
INTRAVENOUS | Status: DC | PRN
  Administered 2023-04-01: 165 ug/kg/min via INTRAVENOUS

## 2023-04-01 MED ORDER — SODIUM CHLORIDE 0.9 % IV SOLN: INTRAVENOUS | Status: DC

## 2023-04-01 MED ORDER — LIDOCAINE HCL (CARDIAC) PF 100 MG/5ML IV SOSY
PREFILLED_SYRINGE | INTRAVENOUS | Status: DC | PRN
  Administered 2023-04-01: 100 mg via INTRAVENOUS

## 2023-04-01 MED ORDER — PROPOFOL 10 MG/ML IV BOLUS
INTRAVENOUS | Status: DC | PRN
  Administered 2023-04-01: 70 mg via INTRAVENOUS
  Administered 2023-04-01: 30 mg via INTRAVENOUS
  Administered 2023-04-01: 20 mg via INTRAVENOUS

## 2023-04-01 NOTE — Transfer of Care (Signed)
Immediate Anesthesia Transfer of Care Note  Patient: Pamela Baldwin  Procedure(s) Performed: COLONOSCOPY WITH PROPOFOL POLYPECTOMY  Patient Location: Endoscopy Unit  Anesthesia Type:General  Level of Consciousness: drowsy and patient cooperative  Airway & Oxygen Therapy: Patient Spontanous Breathing and Patient connected to face mask oxygen  Post-op Assessment: Report given to RN and Post -op Vital signs reviewed and stable  Post vital signs: Reviewed and stable  Last Vitals:  Vitals Value Taken Time  BP 114/57 04/01/23 1315  Temp 36.5 C 04/01/23 1315  Pulse 87 04/01/23 1322  Resp 12 04/01/23 1322  SpO2 96 % 04/01/23 1322  Vitals shown include unfiled device data.  Last Pain:  Vitals:   04/01/23 1315  TempSrc: Temporal  PainSc: Asleep         Complications: No notable events documented.

## 2023-04-01 NOTE — Interval H&P Note (Signed)
History and Physical Interval Note: Preprocedure H&P from 04/01/23  was reviewed and there was no interval change after seeing and examining the patient.  Written consent was obtained from the patient after discussion of risks, benefits, and alternatives. Patient has consented to proceed with Colonoscopy with possible intervention   04/01/2023 12:22 PM  Pamela Baldwin  has presented today for surgery, with the diagnosis of Z12.11 (ICD-10-CM) - Colon cancer screening Z80.0 (ICD-10-CM) - Family history of colon cancer Z83.719 (ICD-10-CM) - Family hx colonic polyps.  The various methods of treatment have been discussed with the patient and family. After consideration of risks, benefits and other options for treatment, the patient has consented to  Procedure(s): COLONOSCOPY WITH PROPOFOL (N/A) as a surgical intervention.  The patient's history has been reviewed, patient examined, no change in status, stable for surgery.  I have reviewed the patient's chart and labs.  Questions were answered to the patient's satisfaction.     Pamela Baldwin

## 2023-04-01 NOTE — Anesthesia Postprocedure Evaluation (Signed)
Anesthesia Post Note  Patient: Pamela Baldwin  Procedure(s) Performed: COLONOSCOPY WITH PROPOFOL POLYPECTOMY  Patient location during evaluation: PACU Anesthesia Type: General Level of consciousness: awake and alert Pain management: pain level controlled Vital Signs Assessment: post-procedure vital signs reviewed and stable Respiratory status: spontaneous breathing, nonlabored ventilation and respiratory function stable Cardiovascular status: blood pressure returned to baseline and stable Postop Assessment: no apparent nausea or vomiting Anesthetic complications: no   No notable events documented.   Last Vitals:  Vitals:   04/01/23 1315 04/01/23 1325  BP: (!) 114/57 114/61  Pulse: 93 87  Resp: 15 13  Temp: 36.5 C   SpO2: 96% 98%    Last Pain:  Vitals:   04/01/23 1325  TempSrc:   PainSc: 0-No pain                 Foye Deer

## 2023-04-01 NOTE — H&P (Signed)
Pre-Procedure H&P   Patient ID: Pamela Baldwin is a 67 y.o. female.  Gastroenterology Provider: [name], DO  Referring Provider: Dr. Richarda Blade PCP: Abram Sander, MD  Date: 04/01/2023  HPI Ms. Pamela Baldwin is a 67 y.o. female who presents today for Colonoscopy for surveillance- phx colon polyps; fhx crc .  Last colonoscopy October 2018 with pandiverticulosis and redundancy.  Personal history of colon polyps.  Family history of colon cancer in her brother and uncle  Pt notes loose bowel movements with increasing stress. Otherwise, regular bowel movements without melena or hematochezia.   Past Medical History:  Diagnosis Date   Diabetes mellitus without complication (HCC)    History of chicken pox    Hypercholesteremia    Hypertension    Scarlet fever     Past Surgical History:  Procedure Laterality Date   ABDOMINAL HYSTERECTOMY  2000   BUNIONECTOMY     CATARACT EXTRACTION  2016   CERVICAL DISC SURGERY     ELBOW BURSA SURGERY     EYE SURGERY     KNEE ARTHROSCOPY     SHOULDER ARTHROSCOPY WITH OPEN ROTATOR CUFF REPAIR Right 04/28/2019   Procedure: RIGHT SHOULDER ARTHROSCOPY, SUBACROMIAL DECOMPRESSION, DISTAL CLAVICLE EXCISION, BICEPS TENODESIS, MINI OPEN ROTATOR CUFF REPAIR;  Surgeon: Juanell Fairly, MD;  Location: [facility] ORS;  Service: Orthopedics;  Laterality: Right;   SHOULDER SURGERY     TONSILLECTOMY      Family History Brother- CRC; Uncle- CRC No h/o GI disease or malignancy  Review of Systems  Constitutional:  Negative for activity change, appetite change, chills, diaphoresis, fatigue, fever and unexpected weight change.  HENT:  Negative for trouble swallowing and voice change.   Respiratory:  Negative for shortness of breath and wheezing.   Cardiovascular:  Negative for chest pain, palpitations and leg swelling.  Gastrointestinal:  Negative for abdominal distention, abdominal pain, anal bleeding, blood in stool, constipation, diarrhea,  nausea, rectal pain and vomiting.  Musculoskeletal:  Negative for arthralgias and myalgias.  Skin:  Negative for color change and pallor.  Neurological:  Negative for dizziness, syncope and weakness.  Psychiatric/Behavioral:  Negative for confusion.   All other systems reviewed and are negative.    Medications No current facility-administered medications on file prior to encounter.   Current Outpatient Medications on File Prior to Encounter  Medication Sig Dispense Refill   baclofen (LIORESAL) 10 MG tablet Take 10 mg by mouth 3 (three) times daily.     cetirizine (ZYRTEC) 10 MG tablet Take 10 mg by mouth daily.     cyclobenzaprine (AMRIX) 30 MG 24 hr capsule Take 30 mg by mouth daily as needed for muscle spasms.     diazepam (VALIUM) 5 MG/ML solution Take 5 mg by mouth every 8 (eight) hours as needed for anxiety.     diclofenac (VOLTAREN) 75 MG EC tablet Take 75 mg by mouth 2 (two) times daily.     ibuprofen (ADVIL) 200 MG tablet Take 800 mg by mouth every 6 (six) hours as needed.     lisinopril (ZESTRIL) 2.5 MG tablet Take 2.5 mg by mouth daily.     metFORMIN (GLUCOPHAGE) 1000 MG tablet Take 1,000 mg by mouth 2 (two) times daily with a meal.      Pietsch CIDER VINEGAR PO Take by mouth.     atorvastatin (LIPITOR) 40 MG tablet Take 40 mg by mouth daily.      Cholecalciferol (VITAMIN D-3) 125 MCG (5000 UT) TABS Take by mouth.  clobetasol (TEMOVATE) 0.05 % external solution Apply 1 Application topically 2 (two) times daily. Mix 50ml in a tub of cerave cream and use bid to aa itchy rash on trunk and legs until clear, avoid face, groin, axilla 50 mL 0   escitalopram (LEXAPRO) 5 MG tablet Take by mouth.     hydroquinone 4 % cream Apply topically 2 (two) times daily. Bid to dark spots on face 28.35 g 3   magnesium oxide (MAG-OX) 400 MG tablet Take 400 mg by mouth daily.     meloxicam (MOBIC) 15 MG tablet Take 1 tablet (15 mg total) by mouth daily. 30 tablet 0   Multiple Vitamin (MULTIVITAMIN)  tablet Take 1 tablet by mouth daily.     Niacin (VITAMIN B-3 PO) Take by mouth.     ondansetron (ZOFRAN) 4 MG tablet Take 1 tablet (4 mg total) by mouth every 8 (eight) hours as needed. 20 tablet 0   PAXLOVID, 300/100, 20 x 150 MG & 10 x 100MG  TBPK Take by mouth.     STIMULANT LAXATIVE 8.6-50 MG tablet Take by mouth.     VITAMIN A PO Take by mouth.     Zinc Acetate, Oral, (ZINC ACETATE PO) Take by mouth.      Pertinent medications related to GI and procedure were reviewed by me with the patient prior to the procedure   Current Facility-Administered Medications:    0.9 %  sodium chloride infusion, , Intravenous, Continuous, [name], DO  sodium chloride         Allergies  Allergen Reactions   Hydrocodone Itching   Morphine And Codeine Nausea And Vomiting   Penicillins Other (See Comments)    Did it involve swelling of the face/tongue/throat, SOB, or low BP? No Did it involve sudden or severe rash/hives, skin peeling, or any reaction on the inside of your mouth or nose? Yes Did you need to seek medical attention at a hospital or doctor's office? Yes When did it last happen?      in her 54s If all above answers are "NO", may proceed with cephalosporin use.    Allergies were reviewed by me prior to the procedure  Objective   Body mass index is 28.49 kg/m. Vitals:   04/01/23 1205  BP: (!) 142/83  Pulse: (!) 102  Resp: 18  Temp: (!) 97.2 F (36.2 C)  TempSrc: Temporal  SpO2: 98%  Weight: 75.3 kg  Height: 5\' 4"  (1.626 m)     Physical Exam Vitals and nursing note reviewed.  Constitutional:      General: She is not in acute distress.    Appearance: Normal appearance. She is not ill-appearing, toxic-appearing or diaphoretic.  HENT:     Head: Normocephalic and atraumatic.     Nose: Nose normal.     Mouth/Throat:     Mouth: Mucous membranes are moist.     Pharynx: Oropharynx is clear.  Eyes:     General: No scleral icterus.    Extraocular Movements:  Extraocular movements intact.  Cardiovascular:     Rate and Rhythm: Regular rhythm. Tachycardia present.     Heart sounds: Normal heart sounds. No murmur heard.    No friction rub. No gallop.  Pulmonary:     Effort: Pulmonary effort is normal. No respiratory distress.     Breath sounds: Normal breath sounds. No wheezing, rhonchi or rales.  Abdominal:     General: Bowel sounds are normal. There is no distension.     Palpations:  Abdomen is soft.     Tenderness: There is no abdominal tenderness. There is no guarding or rebound.  Musculoskeletal:     Cervical back: Neck supple.     Right lower leg: No edema.     Left lower leg: No edema.  Skin:    General: Skin is warm and dry.     Coloration: Skin is not jaundiced or pale.  Neurological:     General: No focal deficit present.     Mental Status: She is alert and oriented to person, place, and time. Mental status is at baseline.  Psychiatric:        Mood and Affect: Mood normal.        Behavior: Behavior normal.        Thought Content: Thought content normal.        Judgment: Judgment normal.      Assessment:  Ms. Pamela Baldwin is a 67 y.o. female  who presents today for Colonoscopy for surveillance- phx colon polyps; fhx crc .  Plan:  Colonoscopy with possible intervention today  Colonoscopy with possible biopsy, control of bleeding, polypectomy, and interventions as necessary has been discussed with the patient/patient representative. Informed consent was obtained from the patient/patient representative after explaining the indication, nature, and risks of the procedure including but not limited to death, bleeding, perforation, missed neoplasm/lesions, cardiorespiratory compromise, and reaction to medications. Opportunity for questions was given and appropriate answers were provided. Patient/patient representative has verbalized understanding is amenable to undergoing the procedure.   [name], DO  [facility]  Gastroenterology  Portions of the record may have been created with voice recognition software. Occasional wrong-word or 'sound-a-like' substitutions may have occurred due to the inherent limitations of voice recognition software.  Read the chart carefully and recognize, using context, where substitutions may have occurred.

## 2023-04-01 NOTE — Op Note (Signed)
[facility] Gastroenterology Patient Name: Pamela Baldwin Procedure Date: 04/01/2023 11:14 AM MRN: [id number] Account #: [id number] Date of Birth: [birth year] Admit Type: Outpatient Age: 67 Room: [facility] ENDO ROOM 2 Gender: Female Note Status: Finalized Instrument Name: Prentice Docker [id number] Procedure:             Colonoscopy Indications:           Screening in patient at increased risk: Family history                         of 1st-degree relative with colorectal cancer, High                         risk colon cancer surveillance: Personal history of                         colonic polyps Providers:             [name] DO, DO Referring MD:          No Local Md, MD (Referring MD) Medicines:             Monitored Anesthesia Care Complications:         No immediate complications. Estimated blood loss:                         Minimal. Procedure:             Pre-Anesthesia Assessment:                        - Prior to the procedure, a History and Physical was                         performed, and patient medications and allergies were                         reviewed. The patient is competent. The risks and                         benefits of the procedure and the sedation options and                         risks were discussed with the patient. All questions                         were answered and informed consent was obtained.                         Patient identification and proposed procedure were                         verified by the physician, the nurse, the anesthetist                         and the technician in the endoscopy suite. Mental                         Status Examination: alert and oriented. Airway  Examination: normal oropharyngeal airway and neck                         mobility. Respiratory Examination: clear to                         auscultation. CV Examination: RRR, no murmurs, no S3                          or S4. Prophylactic Antibiotics: The patient does not                         require prophylactic antibiotics. Prior                         Anticoagulants: The patient has taken no anticoagulant                         or antiplatelet agents. ASA Grade Assessment: II - A                         patient with mild systemic disease. After reviewing                         the risks and benefits, the patient was deemed in                         satisfactory condition to undergo the procedure. The                         anesthesia plan was to use monitored anesthesia care                         (MAC). Immediately prior to administration of                         medications, the patient was re-assessed for adequacy                         to receive sedatives. The heart rate, respiratory                         rate, oxygen saturations, blood pressure, adequacy of                         pulmonary ventilation, and response to care were                         monitored throughout the procedure. The physical                         status of the patient was re-assessed after the                         procedure.                        After obtaining informed consent, the colonoscope was  passed under direct vision. Throughout the procedure,                         the patient's blood pressure, pulse, and oxygen                         saturations were monitored continuously. The                         Colonoscope was introduced through the anus and                         advanced to the the terminal ileum, with                         identification of the appendiceal orifice and IC                         valve. The colonoscopy was technically difficult and                         complex due to multiple diverticula in the colon, a                         redundant colon and a tortuous colon. Successful                         completion of the procedure was  aided by straightening                         and shortening the scope to obtain bowel loop                         reduction, using scope torsion, applying abdominal                         pressure and lavage. The patient tolerated the                         procedure well. The quality of the bowel preparation                         was evaluated using the BBPS [facility] Bowel Preparation                         Scale) with scores of: Right Colon = 2 (minor amount                         of residual staining, small fragments of stool and/or                         opaque liquid, but mucosa seen well), Transverse Colon                         = 2 (minor amount of residual staining, small                         fragments  of stool and/or opaque liquid, but mucosa                         seen well) and Left Colon = 2 (minor amount of                         residual staining, small fragments of stool and/or                         opaque liquid, but mucosa seen well). The total BBPS                         score equals 6. The quality of the bowel preparation                         was good. The ileocecal valve, appendiceal orifice,                         and rectum were photographed. Findings:      Skin tags were found on perianal exam.      The digital rectal exam was normal. Pertinent negatives include normal       sphincter tone.      Multiple small-mouthed diverticula were found in the entire colon.       Severe in the left colon. Estimated blood loss: none.      Non-bleeding internal hemorrhoids were found during retroflexion. The       hemorrhoids were Grade I (internal hemorrhoids that do not prolapse).      Five sessile polyps were found in the descending colon (1), transverse       colon (2) and ascending colon (2). The polyps were 1 to 2 mm in size.       These polyps were removed with a jumbo cold forceps. Resection and       retrieval were complete. Estimated blood loss was  minimal.      Normal mucosa was found in the entire colon. Biopsies for histology were       taken with a cold forceps from the right colon and left colon for       evaluation of microscopic colitis. Estimated blood loss was minimal.      The exam was otherwise without abnormality on direct and retroflexion       views. Impression:            - Perianal skin tags found on perianal exam.                        - Diverticulosis in the entire examined colon.                        - Non-bleeding internal hemorrhoids.                        - Five 1 to 2 mm polyps in the descending colon, in                         the transverse colon and in the ascending colon,  removed with a jumbo cold forceps. Resected and                         retrieved.                        - Normal mucosa in the entire examined colon. Biopsied.                        - The examination was otherwise normal on direct and                         retroflexion views. Recommendation:        - Patient has a contact number available for                         emergencies. The signs and symptoms of potential                         delayed complications were discussed with the patient.                         Return to normal activities tomorrow. Written                         discharge instructions were provided to the patient.                        - Discharge patient to home.                        - Resume previous diet.                        - Continue present medications.                        - Await pathology results.                        - Repeat colonoscopy for surveillance based on                         pathology results.                        - Return to referring physician as previously                         scheduled.                        - The findings and recommendations were discussed with                         the patient. Procedure Code(s):     --- Professional  ---                        [phone], Colonoscopy, flexible; with biopsy, single or  multiple Diagnosis Code(s):     --- Professional ---                        Z80.0, Family history of malignant neoplasm of                         digestive organs                        Z86.010, Personal history of colonic polyps                        K64.0, First degree hemorrhoids                        D12.4, Benign neoplasm of descending colon                        D12.3, Benign neoplasm of transverse colon (hepatic                         flexure or splenic flexure)                        D12.2, Benign neoplasm of ascending colon                        K64.4, Residual hemorrhoidal skin tags                        K57.30, Diverticulosis of large intestine without                         perforation or abscess without bleeding CPT copyright 2022 American Medical Association. All rights reserved. The codes documented in this report are preliminary and upon coder review may  be revised to meet current compliance requirements. Attending Participation:      I personally performed the entire procedure. Elfredia Nevins, DO [name] DO, DO 04/01/2023 1:20:05 PM This report has been signed electronically. Number of Addenda: 0 Note Initiated On: 04/01/2023 11:14 AM Scope Withdrawal Time: 0 hours 19 minutes 37 seconds  Total Procedure Duration: 0 hours 43 minutes 19 seconds  Estimated Blood Loss:  Estimated blood loss was minimal.      [facility]

## 2023-04-01 NOTE — Anesthesia Preprocedure Evaluation (Signed)
Anesthesia Evaluation  Patient identified by MRN, date of birth, ID band Patient awake    Reviewed: Allergy & Precautions, NPO status , Patient's Chart, lab work & pertinent test results  History of Anesthesia Complications Negative for: history of anesthetic complications  Airway Mallampati: III  TM Distance: <3 FB Neck ROM: full    Dental  (+) Chipped   Pulmonary neg pulmonary ROS, neg shortness of breath   Pulmonary exam normal        Cardiovascular Exercise Tolerance: Good hypertension, (-) angina Normal cardiovascular exam     Neuro/Psych negative neurological ROS  negative psych ROS   GI/Hepatic negative GI ROS, Neg liver ROS,neg GERD  ,,  Endo/Other  diabetes, Type 2    Renal/GU negative Renal ROS  negative genitourinary   Musculoskeletal   Abdominal   Peds  Hematology negative hematology ROS (+)   Anesthesia Other Findings Past Medical History: No date: Diabetes mellitus without complication (HCC) No date: History of chicken pox No date: Hypercholesteremia No date: Hypertension No date: Scarlet fever  Past Surgical History: 2000: ABDOMINAL HYSTERECTOMY No date: BUNIONECTOMY 2016: CATARACT EXTRACTION No date: CERVICAL DISC SURGERY No date: ELBOW BURSA SURGERY No date: EYE SURGERY No date: KNEE ARTHROSCOPY 04/28/2019: SHOULDER ARTHROSCOPY WITH OPEN ROTATOR CUFF REPAIR; Right     Comment:  Procedure: RIGHT SHOULDER ARTHROSCOPY, SUBACROMIAL               DECOMPRESSION, DISTAL CLAVICLE EXCISION, BICEPS               TENODESIS, MINI OPEN ROTATOR CUFF REPAIR;  Surgeon:               Juanell Fairly, MD;  Location: [facility] ORS;  Service:               Orthopedics;  Laterality: Right; No date: SHOULDER SURGERY No date: TONSILLECTOMY  BMI    Body Mass Index: 28.49 kg/m      Reproductive/Obstetrics negative OB ROS                             Anesthesia Physical Anesthesia  Plan  ASA: 2  Anesthesia Plan: General   Post-op Pain Management:    Induction: Intravenous  PONV Risk Score and Plan: Propofol infusion and TIVA  Airway Management Planned: Natural Airway and Nasal Cannula  Additional Equipment:   Intra-op Plan:   Post-operative Plan:   Informed Consent: I have reviewed the patients History and Physical, chart, labs and discussed the procedure including the risks, benefits and alternatives for the proposed anesthesia with the patient or authorized representative who has indicated his/her understanding and acceptance.     Dental Advisory Given  Plan Discussed with: Anesthesiologist, CRNA and Surgeon  Anesthesia Plan Comments: (Patient consented for risks of anesthesia including but not limited to:  - adverse reactions to medications - risk of airway placement if required - damage to eyes, teeth, lips or other oral mucosa - nerve damage due to positioning  - sore throat or hoarseness - Damage to heart, brain, nerves, lungs, other parts of body or loss of life  Patient voiced understanding.)       Anesthesia Quick Evaluation

## 2023-04-01 NOTE — Anesthesia Procedure Notes (Signed)
Procedure Name: General with mask airway Date/Time: 04/01/2023 12:43 PM  Performed by: Mohammed Kindle, CRNAPre-anesthesia Checklist: Patient identified, Emergency Drugs available, Suction available and Patient being monitored Patient Re-evaluated:Patient Re-evaluated prior to induction Oxygen Delivery Method: Simple face mask Induction Type: IV induction Placement Confirmation: positive ETCO2, CO2 detector and breath sounds checked- equal and bilateral Dental Injury: Teeth and Oropharynx as per pre-operative assessment

## 2023-04-02 ENCOUNTER — Encounter: Payer: Self-pay | Admitting: Gastroenterology

## 2023-05-19 IMAGING — MR MR FOOT*R* W/O CM
5 series · 40 of 40 positions shown · non-contrast
Comparison: None.

CLINICAL DATA: Foot pain. Knot on the top of the 3rd-0th
metatarsal.

EXAM:
MRI OF THE RIGHT FOREFOOT WITHOUT CONTRAST
TECHNIQUE: Multiplanar, multisequence MR imaging of the right forefoot was
performed. No intravenous contrast was administered.

[Series 4: T2 fat-sat · coronal · 3.0mm · 0.53mm/px · 11 of 50 slices shown]
[im 1/50]
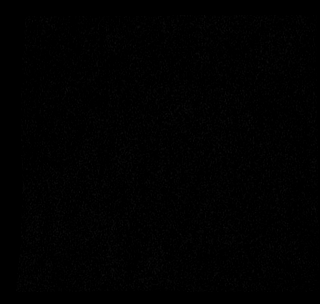
[im 5/50]
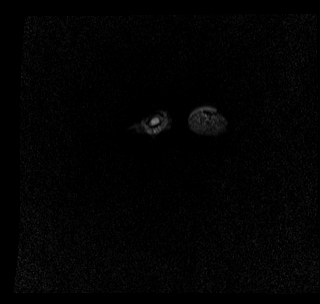
[im 10/50]
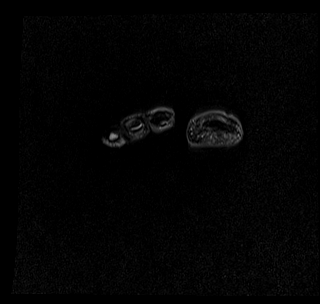
[im 15/50]
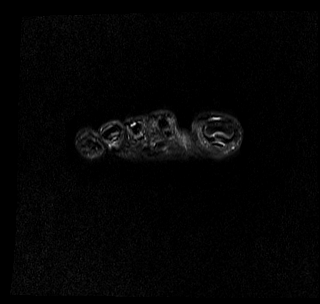
[im 20/50]
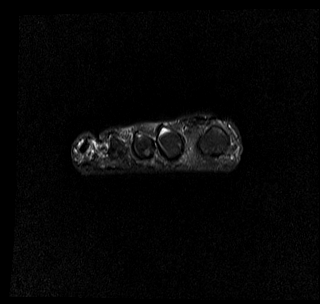
[im 25/50]
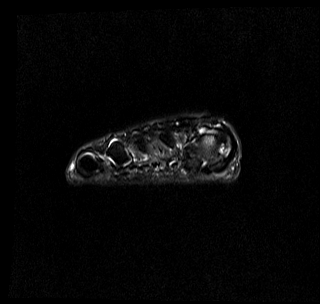
[im 30/50]
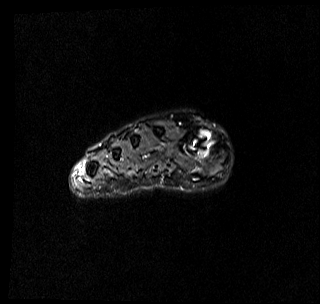
[im 35/50]
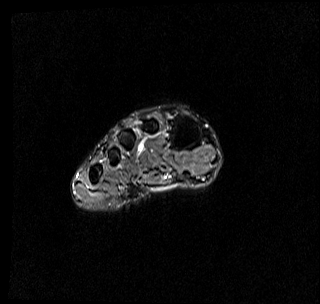
[im 40/50]
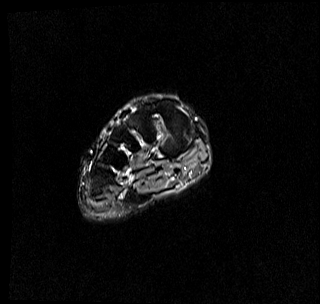
[im 45/50]
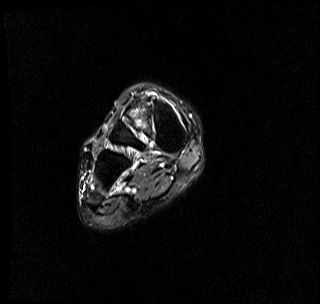
[im 50/50]
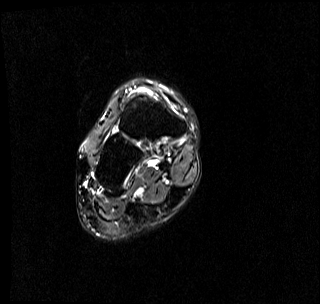

[Series 5: T1 · axial · 3.0mm · 0.53mm/px · z∈[-179,-97]mm · 6 of 30 slices shown (1 of 2)]
[im 1/30]
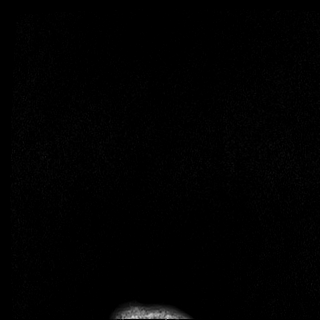
[im 6/30]
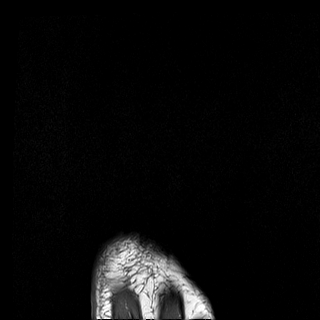
[im 12/30]
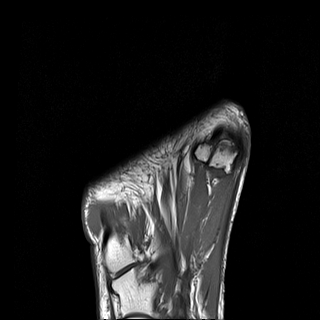
[im 18/30]
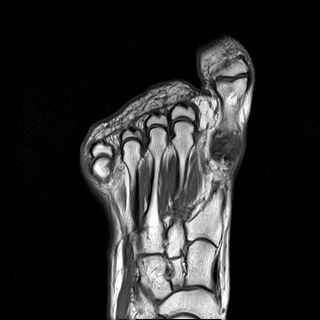
[im 24/30]
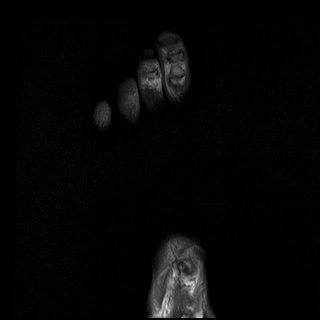
[im 30/30]
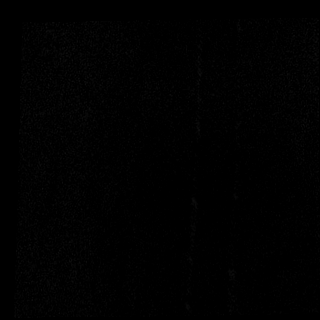

[Series 6: STIR · axial · 3.0mm · 0.66mm/px · z∈[-179,-97]mm · 6 of 30 slices shown (1 of 2)]
[im 1/30]
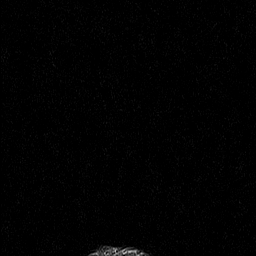
[im 6/30]
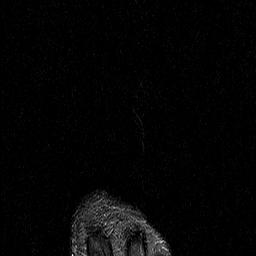
[im 12/30]
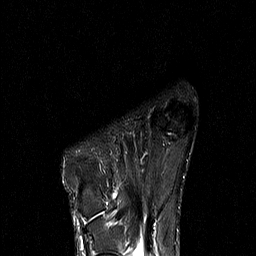
[im 18/30]
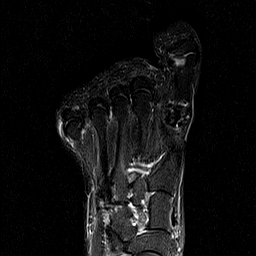
[im 24/30]
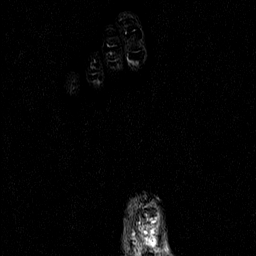
[im 30/30]
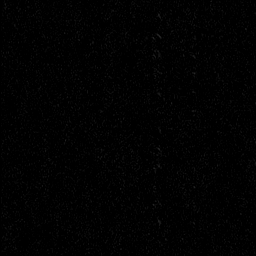

[Series 7: T1 · coronal · 3.0mm · 0.53mm/px · 10 of 50 slices shown (2 of 2)]
[im 1/50]
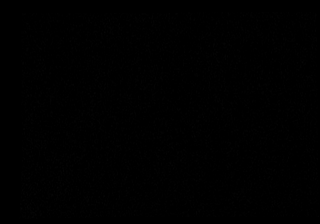
[im 6/50]
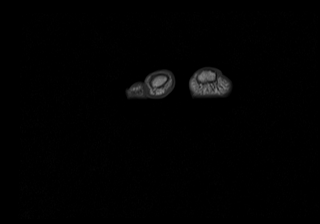
[im 11/50]
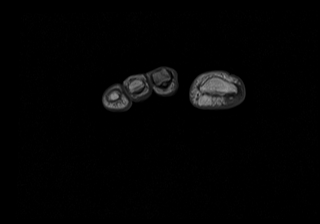
[im 17/50]
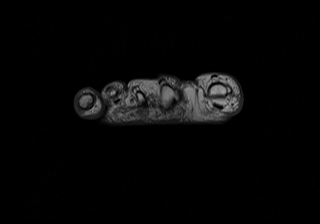
[im 22/50]
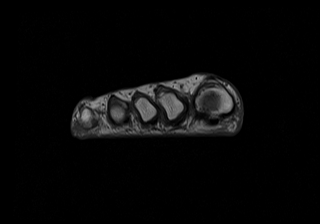
[im 28/50]
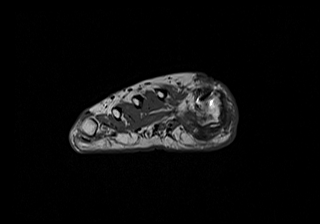
[im 33/50]
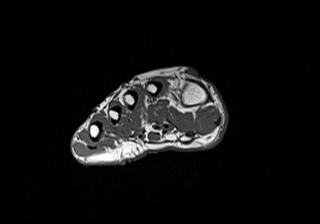
[im 39/50]
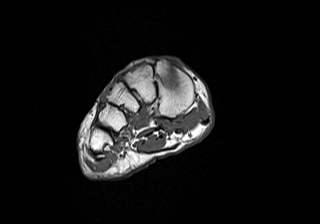
[im 44/50]
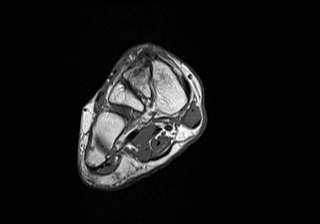
[im 50/50]
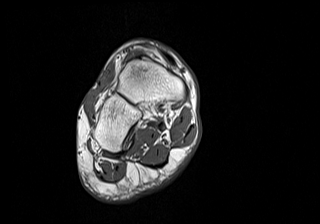

[Series 8: STIR · sagittal · 3.0mm · 0.70mm/px · 7 of 33 slices shown (2 of 2)]
[im 1/33]
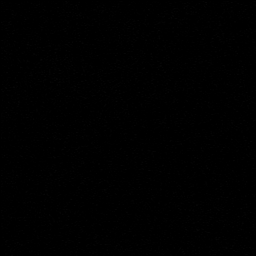
[im 6/33]
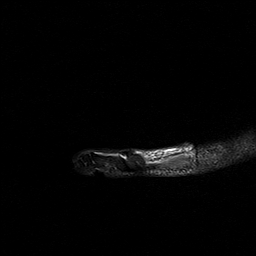
[im 11/33]
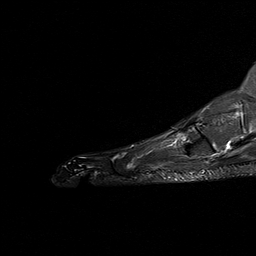
[im 17/33]
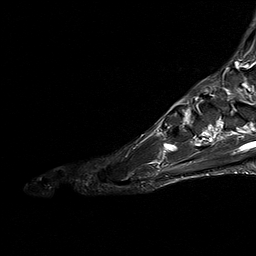
[im 22/33]
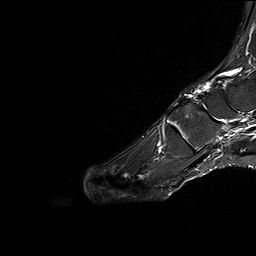
[im 27/33]
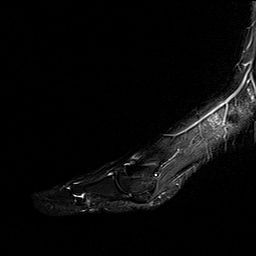
[im 33/33]
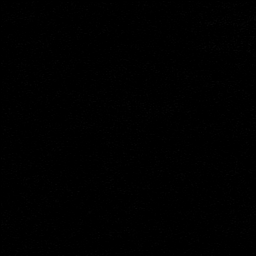

[40 of 40 positions shown; findings below may reference images not displayed]

FINDINGS: Bones/Joint/Cartilage

No fracture or dislocation. Normal alignment. No joint effusion. No
marrow signal abnormality. Mild osteoarthritis of the first MTP
joint.

Postsurgical changes of the first metatarsal neck from prior hallux
valgus repair.

Subchondral reactive marrow edema and cystic changes in the middle
cuneiform at the proximal articulation with the lateral cuneiform
and at the second TMT joint. Mild osteoarthritis of the third and
fourth TMT joints.

Mild osteoarthritis of the talonavicular joint.

Ligaments

Collateral ligaments are intact.  Lisfranc ligament is intact.

Muscles and Tendons

Flexor, peroneal and extensor compartment tendons are intact.
Muscles are normal.

Soft tissue
No fluid collection or hematoma.  No soft tissue mass.
IMPRESSION: 1.  No acute osseous injury of the right forefoot.
2. Mild osteoarthritis of the first MTP joint.Postsurgical changes
of the first metatarsal neck from prior hallux valgus repair.
3. Subchondral reactive marrow edema and cystic changes in the
middle cuneiform at the proximal articulation with the lateral
cuneiform and at the second TMT joint.
4. Mild osteoarthritis of the third and fourth TMT joints.

## 2023-07-02 ENCOUNTER — Ambulatory Visit: Payer: Medicare Other | Admitting: Dermatology

## 2023-07-02 ENCOUNTER — Encounter: Payer: Self-pay | Admitting: Dermatology

## 2023-07-02 DIAGNOSIS — W908XXA Exposure to other nonionizing radiation, initial encounter: Secondary | ICD-10-CM

## 2023-07-02 DIAGNOSIS — Z1283 Encounter for screening for malignant neoplasm of skin: Secondary | ICD-10-CM | POA: Diagnosis not present

## 2023-07-02 DIAGNOSIS — L578 Other skin changes due to chronic exposure to nonionizing radiation: Secondary | ICD-10-CM

## 2023-07-02 DIAGNOSIS — D229 Melanocytic nevi, unspecified: Secondary | ICD-10-CM

## 2023-07-02 DIAGNOSIS — L814 Other melanin hyperpigmentation: Secondary | ICD-10-CM

## 2023-07-02 DIAGNOSIS — D2372 Other benign neoplasm of skin of left lower limb, including hip: Secondary | ICD-10-CM

## 2023-07-02 DIAGNOSIS — L299 Pruritus, unspecified: Secondary | ICD-10-CM

## 2023-07-02 DIAGNOSIS — D1801 Hemangioma of skin and subcutaneous tissue: Secondary | ICD-10-CM

## 2023-07-02 DIAGNOSIS — L821 Other seborrheic keratosis: Secondary | ICD-10-CM

## 2023-07-02 DIAGNOSIS — D1722 Benign lipomatous neoplasm of skin and subcutaneous tissue of left arm: Secondary | ICD-10-CM

## 2023-07-02 DIAGNOSIS — K130 Diseases of lips: Secondary | ICD-10-CM

## 2023-07-02 DIAGNOSIS — L720 Epidermal cyst: Secondary | ICD-10-CM

## 2023-07-02 MED ORDER — TACROLIMUS 0.1 % EX OINT: TOPICAL_OINTMENT | Freq: Two times a day (BID) | CUTANEOUS | 0 refills | Status: AC

## 2023-07-02 NOTE — Patient Instructions (Addendum)
Treatment Plan Start tacrolimus 0.1% ointment to affected area at lip twice daily for 2 weeks then as needed.   Recommend OTC Gold Bond Rapid Relief Anti-Itch cream (pramoxine + menthol), CeraVe Anti-itch cream or lotion (pramoxine), Sarna lotion (Original- menthol + camphor or Sensitive- pramoxine) or Eucerin 12 hour Itch Relief lotion (menthol) up to 3 times per day to areas on body that are itchy.   Melanoma ABCDEs  Melanoma is the most dangerous type of skin cancer, and is the leading cause of death from skin disease.  You are more likely to develop melanoma if you: Have light-colored skin, light-colored eyes, or red or blond hair Spend a lot of time in the sun Tan regularly, either outdoors or in a tanning bed Have had blistering sunburns, especially during childhood Have a close family member who has had a melanoma Have atypical moles or large birthmarks  Early detection of melanoma is key since treatment is typically straightforward and cure rates are extremely high if we catch it early.   The first sign of melanoma is often a change in a mole or a new dark spot.  The ABCDE system is a way of remembering the signs of melanoma.  A for asymmetry:  The two halves do not match. B for border:  The edges of the growth are irregular. C for color:  A mixture of colors are present instead of an even brown color. D for diameter:  Melanomas are usually (but not always) greater than 6mm - the size of a pencil eraser. E for evolution:  The spot keeps changing in size, shape, and color.  Please check your skin once per month between visits. You can use a small mirror in front and a large mirror behind you to keep an eye on the back side or your body.   If you see any new or changing lesions before your next follow-up, please call to schedule a visit.  Please continue daily skin protection including broad spectrum sunscreen SPF 30+ to sun-exposed areas, reapplying every 2 hours as needed when  you're outdoors.    Due to recent changes in healthcare laws, you may see results of your pathology and/or laboratory studies on MyChart before the doctors have had a chance to review them. We understand that in some cases there may be results that are confusing or concerning to you. Please understand that not all results are received at the same time and often the doctors may need to interpret multiple results in order to provide you with the best plan of care or course of treatment. Therefore, we ask that you please give [place] 2 business days to thoroughly review all your results before contacting the office for clarification. Should we see a critical lab result, you will be contacted sooner.   If You Need Anything After Your Visit  If you have any questions or concerns for your doctor, please call our main line at [phone] and press option 4 to reach your doctor's medical assistant. If no one answers, please leave a voicemail as directed and we will return your call as soon as possible. Messages left after 4 pm will be answered the following business day.   You may also send [place] a message via MyChart. We typically respond to MyChart messages within 1-2 business days.  For prescription refills, please ask your pharmacy to contact our office. Our fax number is [phone].  If you have an urgent issue when the clinic is closed that cannot  wait until the next business day, you can page your doctor at the number below.    Please note that while we do our best to be available for urgent issues outside of office hours, we are not available 24/7.   If you have an urgent issue and are unable to reach [place], you may choose to seek medical care at your doctor's office, retail clinic, urgent care center, or emergency room.  If you have a medical emergency, please immediately call 911 or go to the emergency department.  Pager Numbers  - Dr. Gwen Pounds: [phone]  - Dr. Roseanne Reno: [phone]  - Dr.  Katrinka Blazing: [phone]   In the event of inclement weather, please call our main line at [phone] for an update on the status of any delays or closures.  Dermatology Medication Tips: Please keep the boxes that topical medications come in in order to help keep track of the instructions about where and how to use these. Pharmacies typically print the medication instructions only on the boxes and not directly on the medication tubes.   If your medication is too expensive, please contact our office at [phone] option 4 or send [place] a message through MyChart.   We are unable to tell what your co-pay for medications will be in advance as this is different depending on your insurance coverage. However, we may be able to find a substitute medication at lower cost or fill out paperwork to get insurance to cover a needed medication.   If a prior authorization is required to get your medication covered by your insurance company, please allow [place] 1-2 business days to complete this process.  Drug prices often vary depending on where the prescription is filled and some pharmacies may offer cheaper prices.  The website www.goodrx.com contains coupons for medications through different pharmacies. The prices here do not account for what the cost may be with help from insurance (it may be cheaper with your insurance), but the website can give you the price if you did not use any insurance.  - You can print the associated coupon and take it with your prescription to the pharmacy.  - You may also stop by our office during regular business hours and pick up a GoodRx coupon card.  - If you need your prescription sent electronically to a different pharmacy, notify our office through [facility] or by phone at [phone] option 4.

## 2023-07-02 NOTE — Progress Notes (Unsigned)
Follow-Up Visit   Subjective  Pamela Baldwin is a 67 y.o. female who presents for the following: Skin Cancer Screening and Full Body Skin Exam  The patient presents for Total-Body Skin Exam (TBSE) for skin cancer screening and mole check. The patient has spots, moles and lesions to be evaluated, some may be new or changing and the patient may have concern these could be cancer.  Patient with a spot at left upper arm under the skin, does not bother her but she would like to know what it is. Two places at the back that itch but there is nothing visible. Hx of cheilitis, uses HC 2.5% cream but does not seem to be helping. Uses chapstick.  Doesn't think she licks her lips that often.  The following portions of the chart were reviewed this encounter and updated as appropriate: medications, allergies, medical history  Review of Systems:  No other skin or systemic complaints except as noted in HPI or Assessment and Plan.  Objective  Well appearing patient in no apparent distress; mood and affect are within normal limits.  A full examination was performed including scalp, head, eyes, ears, nose, lips, neck, chest, axillae, abdomen, back, buttocks, bilateral upper extremities, bilateral lower extremities, hands, feet, fingers, toes, fingernails, and toenails. All findings within normal limits unless otherwise noted below.   Relevant physical exam findings are noted in the Assessment and Plan.  Mid Back Clear     Assessment & Plan   SKIN CANCER SCREENING PERFORMED TODAY.  ACTINIC DAMAGE - Chronic condition, secondary to cumulative UV/sun exposure - diffuse scaly erythematous macules with underlying dyspigmentation - Recommend daily broad spectrum sunscreen SPF 30+ to sun-exposed areas, reapply every 2 hours as needed.  - Staying in the shade or wearing long sleeves, sun glasses (UVA+UVB protection) and wide brim hats (4-inch brim around the entire circumference of the hat) are also  recommended for sun protection.  - Call for new or changing lesions.  LENTIGINES, SEBORRHEIC KERATOSES, HEMANGIOMAS - Benign normal skin lesions - Benign-appearing - Call for any changes  MELANOCYTIC NEVI - Tan-brown and/or pink-flesh-colored symmetric macules and papules - Benign appearing on exam today - Observation - Call clinic for new or changing moles - Recommend daily use of broad spectrum spf 30+ sunscreen to sun-exposed areas.   CHEILITIS Exam Scaly erythematous patch at upper cutaneous lip above vermillion  Chronic and persistent condition with duration or expected duration over one year. Condition is symptomatic/ bothersome to patient. Not currently at goal.   Treatment Plan Start tacrolimus 0.1% ointment to affected area at lip twice daily for 2 weeks then as needed.  Recommend she d/c chapstick and use plain vaseline ointment or vanicream ointment Discussed Patch testing if not improving- Monday or Tuesday   Lipoma  Location: left upper arm Exam: Subcutaneous rubbery nodule 1.0 cm  Benign-appearing. Exam most consistent with a lipoma. Discussed that a lipoma is a benign fatty growth that can grow over time and sometimes get irritated. Recommend observation if it is not bothersome or changing. Discussed option of ILK injections or surgical excision to remove it if it is growing, symptomatic, or other changes noted. Please call for new or changing lesions so they can be evaluated.   Pruritus Mid Back  Due to Xerosis vs Notalgia Paresthetica, pt also has h/o dermatitis but no rash present today  Recommend mild soap and moisturizing cream 1-2 times daily.  Gentle skin care handout provided.    Recommend OTC Gold Bond  Rapid Relief Anti-Itch cream (pramoxine + menthol), CeraVe Anti-itch cream or lotion (pramoxine), Sarna lotion (Original- menthol + camphor or Sensitive- pramoxine) or Eucerin 12 hour Itch Relief lotion (menthol) up to 3 times per day to areas on body  that are itchy.  May restart clobetasol/CeraVe mix every day/bid prn itch. Patient has at home.    Blue nevus 2 mm blue gray papule at Left Knee - Medial   Vs pencil tattoo   Patient states has been there >50 years   Benign-appearing.  Observation.  Call clinic for new or changing moles.  Recommend daily use of broad spectrum spf 30+ sunscreen to sun-exposed areas.    Milia - tiny firm white papules face - type of cyst - benign - sometimes these will clear with nightly OTC adapalene/Differin 0.1% gel or retinol. - may be extracted if symptomatic - observe  Return for TBSE, with Dr. Roseanne Reno.  Anise Salvo, RMA, am acting as scribe for Willeen Niece, MD .   Documentation: I have reviewed the above documentation for accuracy and completeness, and I agree with the above.  Willeen Niece, MD    Follow-Up Visit   Subjective  Pamela Baldwin is a 67 y.o. female who presents for the following: Skin Cancer Screening and Full Body Skin Exam  The patient presents for Total-Body Skin Exam (TBSE) for skin cancer screening and mole check. The patient has spots, moles and lesions to be evaluated, some may be new or changing and the patient may have concern these could be cancer.    The following portions of the chart were reviewed this encounter and updated as appropriate: medications, allergies, medical history  Review of Systems:  No other skin or systemic complaints except as noted in HPI or Assessment and Plan.  Objective  Well appearing patient in no apparent distress; mood and affect are within normal limits.  A full examination was performed including scalp, head, eyes, ears, nose, lips, neck, chest, axillae, abdomen, back, buttocks, bilateral upper extremities, bilateral lower extremities, hands, feet, fingers, toes, fingernails, and toenails. All findings within normal limits unless otherwise noted below.   Relevant physical exam findings are noted in the Assessment  and Plan.  Mid Back Clear     Assessment & Plan   SKIN CANCER SCREENING PERFORMED TODAY.  ACTINIC DAMAGE - Chronic condition, secondary to cumulative UV/sun exposure - diffuse scaly erythematous macules with underlying dyspigmentation - Recommend daily broad spectrum sunscreen SPF 30+ to sun-exposed areas, reapply every 2 hours as needed.  - Staying in the shade or wearing long sleeves, sun glasses (UVA+UVB protection) and wide brim hats (4-inch brim around the entire circumference of the hat) are also recommended for sun protection.  - Call for new or changing lesions.  LENTIGINES, SEBORRHEIC KERATOSES, HEMANGIOMAS - Benign normal skin lesions - Benign-appearing - Call for any changes  MELANOCYTIC NEVI - Tan-brown and/or pink-flesh-colored symmetric macules and papules - Benign appearing on exam today - Observation - Call clinic for new or changing moles - Recommend daily use of broad spectrum spf 30+ sunscreen to sun-exposed areas.        Return for TBSE, with Dr. Roseanne Reno.  ***  Documentation: I have reviewed the above documentation for accuracy and completeness, and I agree with the above.  Willeen Niece, MD

## 2023-12-23 ENCOUNTER — Other Ambulatory Visit: Payer: Self-pay | Admitting: Family Medicine

## 2023-12-23 DIAGNOSIS — Z1231 Encounter for screening mammogram for malignant neoplasm of breast: Secondary | ICD-10-CM

## 2024-01-27 ENCOUNTER — Ambulatory Visit
Admission: RE | Admit: 2024-01-27 | Discharge: 2024-01-27 | Disposition: A | Discharge status: Home / Self Care | Source: Ambulatory Visit | Attending: Family Medicine | Admitting: Family Medicine

## 2024-01-27 DIAGNOSIS — Z1231 Encounter for screening mammogram for malignant neoplasm of breast: Secondary | ICD-10-CM | POA: Insufficient documentation

## 2024-07-07 ENCOUNTER — Ambulatory Visit (INDEPENDENT_AMBULATORY_CARE_PROVIDER_SITE_OTHER): Payer: Federal, State, Local not specified - PPO | Admitting: Dermatology

## 2024-07-07 DIAGNOSIS — W908XXA Exposure to other nonionizing radiation, initial encounter: Secondary | ICD-10-CM | POA: Diagnosis not present

## 2024-07-07 DIAGNOSIS — L814 Other melanin hyperpigmentation: Secondary | ICD-10-CM | POA: Diagnosis not present

## 2024-07-07 DIAGNOSIS — L820 Inflamed seborrheic keratosis: Secondary | ICD-10-CM | POA: Diagnosis not present

## 2024-07-07 DIAGNOSIS — D1801 Hemangioma of skin and subcutaneous tissue: Secondary | ICD-10-CM

## 2024-07-07 DIAGNOSIS — L821 Other seborrheic keratosis: Secondary | ICD-10-CM

## 2024-07-07 DIAGNOSIS — D229 Melanocytic nevi, unspecified: Secondary | ICD-10-CM

## 2024-07-07 DIAGNOSIS — L578 Other skin changes due to chronic exposure to nonionizing radiation: Secondary | ICD-10-CM

## 2024-07-07 DIAGNOSIS — Z1283 Encounter for screening for malignant neoplasm of skin: Secondary | ICD-10-CM

## 2024-07-07 DIAGNOSIS — R238 Other skin changes: Secondary | ICD-10-CM

## 2024-07-07 DIAGNOSIS — L720 Epidermal cyst: Secondary | ICD-10-CM

## 2024-07-07 DIAGNOSIS — L729 Follicular cyst of the skin and subcutaneous tissue, unspecified: Secondary | ICD-10-CM

## 2024-07-07 NOTE — Progress Notes (Signed)
 Follow-Up Visit   Subjective  Pamela Baldwin is a 68 y.o. female who presents for the following: Skin Cancer Screening and Full Body Skin Exam  The patient presents for Total-Body Skin Exam (TBSE) for skin cancer screening and mole check. The patient has spots, moles and lesions to be evaluated, some may be new or changing. She has an itchy spot on the back and itchy mole at the upper abdomen. Itchy spot on the left jaw that has been treated twice and comes back. She has an itchy growth at the gluteal cleft that she noticed this summer. Was excised in the past.    The following portions of the chart were reviewed this encounter and updated as appropriate: medications, allergies, medical history  Review of Systems:  No other skin or systemic complaints except as noted in HPI or Assessment and Plan.  Objective  Well appearing patient in no apparent distress; mood and affect are within normal limits.  A full examination was performed including scalp, head, eyes, ears, nose, lips, neck, chest, axillae, abdomen, back, buttocks, bilateral upper extremities, bilateral lower extremities, hands, feet, fingers, toes, fingernails, and toenails. All findings within normal limits unless otherwise noted below.   Relevant physical exam findings are noted in the Assessment and Plan.  L inf jaw (recurrent) x 1, L spinal upper back x 1 (2) Erythematous stuck-on, waxy papule or plaque  Assessment & Plan   SKIN CANCER SCREENING PERFORMED TODAY.  ACTINIC DAMAGE - Chronic condition, secondary to cumulative UV/sun exposure - diffuse scaly erythematous macules with underlying dyspigmentation - Recommend daily broad spectrum sunscreen SPF 30+ to sun-exposed areas, reapply every 2 hours as needed.  - Staying in the shade or wearing long sleeves, sun glasses (UVA+UVB protection) and wide brim hats (4-inch brim around the entire circumference of the hat) are also recommended for sun protection.  - Call for  new or changing lesions.  LENTIGINES, SEBORRHEIC KERATOSES, HEMANGIOMAS (including Left upper abdomen)- Benign normal skin lesions - Benign-appearing - Call for any changes  SPIDER ANGIOMA Exam: 1.5 mm blanching red papule left upper lip  Discussed benign nature. Recommend observation. Call for changes. Discussed cosmetic procedure ED, noncovered.  $60 for 1st lesion and $15 for each additional lesion if done on the same day.  Maximum charge $350.  One touch-up treatment included no charge. Discussed risks of treatment including dyspigmentation, small scar, and/or recurrence. Recommend daily broad spectrum sunscreen SPF 30+/photoprotection to treated areas once healed.  MELANOCYTIC NEVI - Tan-brown and/or pink-flesh-colored symmetric macules and papules - Benign appearing on exam today - Observation - Call clinic for new or changing moles - Recommend daily use of broad spectrum spf 30+ sunscreen to sun-exposed areas.   Blue nevus Vs pencil tattoo 2 mm blue gray papule at Left Knee - Medial Patient states has been there >50 years. Not examined today. No changes per patient.    Benign-appearing.  Observation.  Call clinic for new or changing moles.  Recommend daily use of broad spectrum spf 30+ sunscreen to sun-exposed areas.    EPIDERMAL INCLUSION CYST Exam: Firm blue white sq nodule at left medial gluteal cleft 6 mm.  Benign-appearing. Exam most consistent with an epidermal inclusion cyst. Discussed that a cyst is a benign growth that can grow over time and sometimes get irritated or inflamed. Recommend observation if it is not bothersome. Discussed option of surgical excision to remove it if it is growing, symptomatic, or other changes noted. Please call for new or  changing lesions so they can be evaluated.  INFLAMED SEBORRHEIC KERATOSIS (2) L inf jaw (recurrent) x 1, L spinal upper back x 1 (2) Symptomatic, irritating, patient would like treated. Destruction of lesion - L inf jaw  (recurrent) x 1, L spinal upper back x 1 (2)  Destruction method: cryotherapy   Informed consent: discussed and consent obtained   Lesion destroyed using liquid nitrogen: Yes   Region frozen until ice ball extended beyond lesion: Yes   Outcome: patient tolerated procedure well with no complications   Post-procedure details: wound care instructions given   Additional details:  Prior to procedure, discussed risks of blister formation, small wound, skin dyspigmentation, or rare scar following cryotherapy. Recommend Vaseline ointment to treated areas while healing.   Return in about 1 year (around 07/07/2025) for TBSE. As scheduled for cyst excision 07/13/24.  IAndrea Kerns, CMA, am acting as scribe for [name], MD .   Documentation: I have reviewed the above documentation for accuracy and completeness, and I agree with the above.  [name], MD

## 2024-07-07 NOTE — Patient Instructions (Addendum)
 Pre-Operative Instructions You are scheduled for a surgical procedure at [facility]. We recommend you read the following instructions. If you have any questions or concerns, please call the office at [phone].  Shower and wash the entire body with soap and water the day of your surgery paying special attention to cleansing at and around the planned surgery site.  Please continue to take your anticoagulants (blood thinners) as you normally     would before and after surgery if they were prescribed by a medical provider. Stopping them could be harmful to you. We have multiple tools in dermatology to stop the bleeding even if you take an anticoagulant. If you take over the counter blood thinner such as aspirin, Ibuprofen (Motrin, Advil and Nuprin), Naprosyn, Voltaren , Relafen, etc. that was not prescribed or recommended by a medical provider, we recommend that you stop taking it for a week before your surgery and wait to restart until 2 days after your surgery.  Please inform us  of all medications you are currently taking. All medications that are taken regularly should be taken the day of surgery as you always do. Nevertheless, we need to be informed of what medications you are taking prior to surgery to know whether they will affect the procedure or cause any complications.   Please inform us  of any medication allergies. Also inform us  of whether you have allergies to Latex or rubber products or whether you have had any adverse reaction to Lidocaine  or Epinephrine .  Please inform us  of any prosthetic or artificial body parts such as artificial heart valve, joint replacements, etc., or similar condition that might require preoperative antibiotics.   We recommend avoidance of alcohol at least two weeks prior to surgery and continued avoidance for at least two weeks after surgery.   We recommend discontinuation of tobacco smoking at least two weeks prior to surgery and continued  abstinence for at least two weeks after surgery.  Do not plan strenuous exercise, strenuous work or strenuous lifting for approximately four weeks after your surgery.   We request if you are unable to make your scheduled surgical appointment, please call us  at least a week in advance or as soon as you are aware of a problem so that we can cancel or reschedule the appointment.   You MAY TAKE TYLENOL  (acetaminophen ) for pain as it is not a blood thinner.   PLEASE PLAN TO BE IN TOWN FOR TWO WEEKS FOLLOWING SURGERY, THIS IS IMPORTANT SO YOU CAN BE CHECKED FOR DRESSING CHANGES, FUTURE REMOVAL AND TO MONITOR FOR POSSIBLE COMPLICATIONS.   Seborrheic Keratosis  What causes seborrheic keratoses? Seborrheic keratoses are harmless, common skin growths that first appear during adult life.  As time goes by, more growths appear.  Some people may develop a large number of them.  Seborrheic keratoses appear on both covered and uncovered body parts.  They are not caused by sunlight.  The tendency to develop seborrheic keratoses can be inherited.  They vary in color from skin-colored to gray, brown, or even black.  They can be either smooth or have a rough, warty surface.   Seborrheic keratoses are superficial and look as if they were stuck on the skin.  Under the microscope this type of keratosis looks like layers upon layers of skin.  That is why at times the top layer may seem to fall off, but the rest of the growth remains and re-grows.    Treatment Seborrheic keratoses do not need to be treated,  but can easily be removed in the office.  Seborrheic keratoses often cause symptoms when they rub on clothing or jewelry.  Lesions can be in the way of shaving.  If they become inflamed, they can cause itching, soreness, or burning.  Removal of a seborrheic keratosis can be accomplished by freezing, burning, or surgery. If any spot bleeds, scabs, or grows rapidly, please return to have it checked, as these can be an  indication of a skin cancer.   Due to recent changes in healthcare laws, you may see results of your pathology and/or laboratory studies on MyChart before the doctors have had a chance to review them. We understand that in some cases there may be results that are confusing or concerning to you. Please understand that not all results are received at the same time and often the doctors may need to interpret multiple results in order to provide you with the best plan of care or course of treatment. Therefore, we ask that you please give us  2 business days to thoroughly review all your results before contacting the office for clarification. Should we see a critical lab result, you will be contacted sooner.   If You Need Anything After Your Visit  If you have any questions or concerns for your doctor, please call our main line at [phone] and press option 4 to reach your doctor's medical assistant. If no one answers, please leave a voicemail as directed and we will return your call as soon as possible. Messages left after 4 pm will be answered the following business day.   You may also send us  a message via MyChart. We typically respond to MyChart messages within 1-2 business days.  For prescription refills, please ask your pharmacy to contact our office. Our fax number is [phone].  If you have an urgent issue when the clinic is closed that cannot wait until the next business day, you can page your doctor at the number below.    Please note that while we do our best to be available for urgent issues outside of office hours, we are not available 24/7.   If you have an urgent issue and are unable to reach us , you may choose to seek medical care at your doctor's office, retail clinic, urgent care center, or emergency room.  If you have a medical emergency, please immediately call 911 or go to the emergency department.  Pager Numbers  - Dr. Hester: [phone]  - Dr. Jackquline:  [phone]  - Dr. Claudene: [phone]   - Dr. Raymund: [phone]  In the event of inclement weather, please call our main line at [phone] for an update on the status of any delays or closures.  Dermatology Medication Tips: Please keep the boxes that topical medications come in in order to help keep track of the instructions about where and how to use these. Pharmacies typically print the medication instructions only on the boxes and not directly on the medication tubes.   If your medication is too expensive, please contact our office at [phone] option 4 or send us  a message through MyChart.   We are unable to tell what your co-pay for medications will be in advance as this is different depending on your insurance coverage. However, we may be able to find a substitute medication at lower cost or fill out paperwork to get insurance to cover a needed medication.   If a prior authorization is required to get your medication covered by your insurance company, please  allow us  1-2 business days to complete this process.  Drug prices often vary depending on where the prescription is filled and some pharmacies may offer cheaper prices.  The website www.goodrx.com contains coupons for medications through different pharmacies. The prices here do not account for what the cost may be with help from insurance (it may be cheaper with your insurance), but the website can give you the price if you did not use any insurance.  - You can print the associated coupon and take it with your prescription to the pharmacy.  - You may also stop by our office during regular business hours and pick up a GoodRx coupon card.  - If you need your prescription sent electronically to a different pharmacy, notify our office through [facility] or by phone at [phone] option 4.     Si Usted Necesita Algo Despus de Su Visita  Tambin puede enviarnos un mensaje a travs de Clinical Cytogeneticist. Por lo general  respondemos a los mensajes de MyChart en el transcurso de 1 a 2 das hbiles.  Para renovar recetas, por favor pida a su farmacia que se ponga en contacto con nuestra oficina. Randi lakes de fax es [place] [phone].  Si tiene un asunto urgente cuando la clnica est cerrada y que no puede esperar hasta el siguiente da hbil, puede llamar/localizar a su doctor(a) al nmero que aparece a continuacin.   Por favor, tenga en cuenta que aunque hacemos todo lo posible para estar disponibles para asuntos urgentes fuera del horario de [place], no estamos disponibles las 24 horas del da, los 7 [address]  [address] de la [place].   Si tiene un problema urgente y no puede comunicarse con nosotros, puede optar por buscar atencin mdica  en el consultorio de su doctor(a), en una clnica privada, en un centro de atencin urgente o en una sala de emergencias.  Si tiene engineer, drilling, por favor llame inmediatamente al 911 o vaya a la sala de emergencias.  Nmeros de bper  - Dr. Hester: [phone]  - Dra. Jackquline: [phone]  - Dr. Claudene: [phone]  - Dra. Kitts: [phone]  En caso de inclemencias del [place], por favor llame a nuestra lnea principal al [phone] para una actualizacin sobre el estado de cualquier retraso o cierre.  Consejos para la medicacin en dermatologa: Por favor, guarde las cajas en las que vienen los medicamentos de uso tpico para ayudarle a seguir las instrucciones sobre dnde y cmo usarlos. Las farmacias generalmente imprimen las instrucciones del medicamento slo en las cajas y no directamente en los tubos del [place].   Si su medicamento es muy caro, por favor, pngase en contacto con landry rieger llamando al [phone] y presione la opcin 4 o envenos un mensaje a travs de Clinical Cytogeneticist.   No podemos decirle cul ser su copago por los medicamentos por adelantado ya que esto es diferente dependiendo de la cobertura de su seguro. Sin embargo, es posible  que podamos encontrar un medicamento sustituto a audiological scientist un formulario para que el seguro cubra el medicamento que se considera necesario.   Si se requiere una autorizacin previa para que su compaa de seguros cubra su medicamento, por favor permtanos de 1 a 2 das hbiles para completar este proceso.  Los precios de los medicamentos varan con frecuencia dependiendo del environmental consultant de dnde se surte la receta y alguna farmacias pueden ofrecer precios ms baratos.  El sitio web www.goodrx.com tiene cupones para medicamentos de health and safety inspector. Los precios aqu no  tienen en cuenta lo que podra costar con la ayuda del seguro (puede ser ms barato con su seguro), pero el sitio web puede darle el precio si no visual merchandiser.  - Puede imprimir el cupn correspondiente y llevarlo con su receta a la farmacia.  - Tambin puede pasar por nuestra oficina durante el horario de atencin regular y education officer, museum una tarjeta de cupones de GoodRx.  - Si necesita que su receta se enve electrnicamente a una farmacia diferente, informe a nuestra oficina a travs de MyChart de [place] o por telfono llamando al [phone] y presione la opcin 4.

## 2024-07-13 ENCOUNTER — Ambulatory Visit: Admitting: Dermatology

## 2024-07-13 ENCOUNTER — Encounter: Payer: Self-pay | Admitting: Dermatology

## 2024-07-13 DIAGNOSIS — D492 Neoplasm of unspecified behavior of bone, soft tissue, and skin: Secondary | ICD-10-CM

## 2024-07-13 DIAGNOSIS — D235 Other benign neoplasm of skin of trunk: Secondary | ICD-10-CM | POA: Diagnosis not present

## 2024-07-13 MED ORDER — MUPIROCIN 2 % EX OINT: TOPICAL_OINTMENT | CUTANEOUS | 0 refills | Status: AC

## 2024-07-13 NOTE — Patient Instructions (Signed)

## 2024-07-13 NOTE — Progress Notes (Signed)
   Follow-Up Visit   Subjective  Pamela Baldwin is a 68 y.o. female who presents for the following: cyst vs other gluteal cleft, pt presents for excision today   The following portions of the chart were reviewed this encounter and updated as appropriate: medications, allergies, medical history  Review of Systems:  No other skin or systemic complaints except as noted in HPI or Assessment and Plan.  Objective  Well appearing patient in no apparent distress; mood and affect are within normal limits.   A focused examination was performed of the following areas: buttocks  Relevant exam findings are noted in the Assessment and Plan.  L Gluteal Cleft 6.26mm violaceous thin papule with punctum   Assessment & Plan     NEOPLASM OF SKIN L Gluteal Cleft Skin excision  Excision method:  elliptical Lesion length (cm):  0.6 Lesion width (cm):  0.6 Margin per side (cm):  0.1 Total excision diameter (cm):  0.8 Informed consent: discussed and consent obtained   Timeout: patient name, date of birth, surgical site, and procedure verified   Procedure prep:  Patient was prepped and draped in usual sterile fashion Prep type:  Povidone-iodine Anesthesia: the lesion was anesthetized in a standard fashion   Anesthetic:  1% lidocaine  w/ epinephrine  1-100,000 buffered w/ 8.4% NaHCO3 (12cc lido w/ epi) Instrument used comment:  #15c blade Hemostasis achieved with: pressure and electrodesiccation   Outcome: patient tolerated procedure well with no complications    Skin repair Complexity:  Intermediate Final length (cm):  1.4 Informed consent: discussed and consent obtained   Reason for type of repair: reduce tension to allow closure, reduce the risk of dehiscence, infection, and necrosis, reduce subcutaneous dead space and avoid a hematoma, preserve normal anatomical and functional relationships and enhance both functionality and cosmetic results   Undermining: edges undermined   Subcutaneous  layers (deep stitches):  Suture size:  4-0 Suture type: Vicryl (polyglactin 910)   Subcutaneous suture technique: inverted dermal. Fine/surface layer approximation (top stitches):  Suture size:  4-0 Suture type: nylon   Stitches: simple interrupted   Suture removal (days):  7 Hemostasis achieved with: suture Outcome: patient tolerated procedure well with no complications   Post-procedure details: sterile dressing applied and wound care instructions given   Dressing type: pressure dressing (Mupirocin  ointment)    Specimen 1 - Surgical pathology Differential Diagnosis: Cyst vs Hemangioma vs Blue Nevus other Check Margins: No 6.49mm violaceous thin pap with punctum Start mupirocin  2% ointment once daily with bandage changes.  Return in about 1 week (around 07/20/2024) for suture removal, and cosmetic ED and LN2 to Hemangioma and SK on face.  I, [name], RMA, am acting as scribe for [name], MD .   Documentation: I have reviewed the above documentation for accuracy and completeness, and I agree with the above.  [name], MD

## 2024-07-14 ENCOUNTER — Telehealth: Payer: Self-pay

## 2024-07-14 NOTE — Telephone Encounter (Signed)
 Left pt msg to call if any problems after yesterday's surgery.Pamela Baldwin

## 2024-07-14 NOTE — Telephone Encounter (Signed)
 Patient called LM on VM she is doing well post surgery, she's a little sore but doing well

## 2024-07-15 LAB — SURGICAL PATHOLOGY

## 2024-07-20 ENCOUNTER — Ambulatory Visit (INDEPENDENT_AMBULATORY_CARE_PROVIDER_SITE_OTHER): Admitting: Dermatology

## 2024-07-20 ENCOUNTER — Ambulatory Visit: Payer: Self-pay | Admitting: Dermatology

## 2024-07-20 DIAGNOSIS — D1801 Hemangioma of skin and subcutaneous tissue: Secondary | ICD-10-CM

## 2024-07-20 DIAGNOSIS — L821 Other seborrheic keratosis: Secondary | ICD-10-CM

## 2024-07-20 DIAGNOSIS — Z48817 Encounter for surgical aftercare following surgery on the skin and subcutaneous tissue: Secondary | ICD-10-CM

## 2024-07-20 DIAGNOSIS — D229 Melanocytic nevi, unspecified: Secondary | ICD-10-CM

## 2024-07-20 NOTE — Patient Instructions (Signed)
 Cryotherapy Aftercare  Wash gently with soap and water everyday.   Apply Vaseline and Band-Aid daily until healed.   After Suture Removal  If your medical team has placed Steri-Strips (white adhesive strips covering the surgical site to provide extra support): Keep the area dry until they fall off.  Do not peel them off. Just let them fall off on their own.  If the edges peel up, you can trim them with scissors.   If your team has not placed Steri-Strips: Wash the area daily with soap and water. Then coat the incision site with plain Vaseline and cover with a bandage. Do this daily for 5 days after the sutures are removed. After that, no additional wound care is generally needed.  However, if you would like to help fade the scar, you can apply a silicone scar cream, gel or sheet every night. The scar will remodel for one year after the procedure. If a skin cancer was removed, be sure to keep your appointment with your dermatologist for follow-up and let your dermatology team know if you have any new or changing spots between visits.    Please call our office at [phone] for any questions or concerns.  Due to recent changes in healthcare laws, you may see results of your pathology and/or laboratory studies on MyChart before the doctors have had a chance to review them. We understand that in some cases there may be results that are confusing or concerning to you. Please understand that not all results are received at the same time and often the doctors may need to interpret multiple results in order to provide you with the best plan of care or course of treatment. Therefore, we ask that you please give us  2 business days to thoroughly review all your results before contacting the office for clarification. Should we see a critical lab result, you will be contacted sooner.   If You Need Anything After Your Visit  If you have any questions or concerns for your doctor, please call our main  line at [phone] and press option 4 to reach your doctor's medical assistant. If no one answers, please leave a voicemail as directed and we will return your call as soon as possible. Messages left after 4 pm will be answered the following business day.   You may also send us  a message via MyChart. We typically respond to MyChart messages within 1-2 business days.  For prescription refills, please ask your pharmacy to contact our office. Our fax number is [phone].  If you have an urgent issue when the clinic is closed that cannot wait until the next business day, you can page your doctor at the number below.    Please note that while we do our best to be available for urgent issues outside of office hours, we are not available 24/7.   If you have an urgent issue and are unable to reach us , you may choose to seek medical care at your doctor's office, retail clinic, urgent care center, or emergency room.  If you have a medical emergency, please immediately call 911 or go to the emergency department.  Pager Numbers  - Dr. Hester: [phone]  - Dr. Jackquline: [phone]  - Dr. Claudene: [phone]   - Dr. Raymund: [phone]  In the event of inclement weather, please call our main line at [phone] for an update on the status of any delays or closures.  Dermatology Medication Tips: Please keep the boxes that topical medications come  in in order to help keep track of the instructions about where and how to use these. Pharmacies typically print the medication instructions only on the boxes and not directly on the medication tubes.   If your medication is too expensive, please contact our office at [phone] option 4 or send us  a message through MyChart.   We are unable to tell what your co-pay for medications will be in advance as this is different depending on your insurance coverage. However, we may be able to find a substitute medication at lower cost or fill out  paperwork to get insurance to cover a needed medication.   If a prior authorization is required to get your medication covered by your insurance company, please allow us  1-2 business days to complete this process.  Drug prices often vary depending on where the prescription is filled and some pharmacies may offer cheaper prices.  The website www.goodrx.com contains coupons for medications through different pharmacies. The prices here do not account for what the cost may be with help from insurance (it may be cheaper with your insurance), but the website can give you the price if you did not use any insurance.  - You can print the associated coupon and take it with your prescription to the pharmacy.  - You may also stop by our office during regular business hours and pick up a GoodRx coupon card.  - If you need your prescription sent electronically to a different pharmacy, notify our office through [facility] or by phone at [phone] option 4.     Si Usted Necesita Algo Despus de Su Visita  Tambin puede enviarnos un mensaje a travs de Clinical Cytogeneticist. Por lo general respondemos a los mensajes de MyChart en el transcurso de 1 a 2 das hbiles.  Para renovar recetas, por favor pida a su farmacia que se ponga en contacto con nuestra oficina. Randi lakes de fax es [place] [phone].  Si tiene un asunto urgente cuando la clnica est cerrada y que no puede esperar hasta el siguiente da hbil, puede llamar/localizar a su doctor(a) al nmero que aparece a continuacin.   Por favor, tenga en cuenta que aunque hacemos todo lo posible para estar disponibles para asuntos urgentes fuera del horario de [place], no estamos disponibles las 24 horas del da, los 7 [address]  [address] de la [place].   Si tiene un problema urgente y no puede comunicarse con nosotros, puede optar por buscar atencin mdica  en el consultorio de su doctor(a), en una clnica privada, en un centro de atencin urgente o en una sala de  emergencias.  Si tiene engineer, drilling, por favor llame inmediatamente al 911 o vaya a la sala de emergencias.  Nmeros de bper  - Dr. Hester: [phone]  - Dra. Jackquline: [phone]  - Dr. Claudene: [phone]  - Dra. Kitts: [phone]  En caso de inclemencias del [place], por favor llame a nuestra lnea principal al [phone] para una actualizacin sobre el estado de cualquier retraso o cierre.  Consejos para la medicacin en dermatologa: Por favor, guarde las cajas en las que vienen los medicamentos de uso tpico para ayudarle a seguir las instrucciones sobre dnde y cmo usarlos. Las farmacias generalmente imprimen las instrucciones del medicamento slo en las cajas y no directamente en los tubos del [place].   Si su medicamento es muy caro, por favor, pngase en contacto con landry rieger llamando al [phone] y presione la opcin 4 o envenos un mensaje a travs de Clinical Cytogeneticist.  No podemos decirle cul ser su copago por los medicamentos por adelantado ya que esto es diferente dependiendo de la cobertura de su seguro. Sin embargo, es posible que podamos encontrar un medicamento sustituto a audiological scientist un formulario para que el seguro cubra el medicamento que se considera necesario.   Si se requiere una autorizacin previa para que su compaa de seguros cubra su medicamento, por favor permtanos de 1 a 2 das hbiles para completar este proceso.  Los precios de los medicamentos varan con frecuencia dependiendo del environmental consultant de dnde se surte la receta y alguna farmacias pueden ofrecer precios ms baratos.  El sitio web www.goodrx.com tiene cupones para medicamentos de health and safety inspector. Los precios aqu no tienen en cuenta lo que podra costar con la ayuda del seguro (puede ser ms barato con su seguro), pero el sitio web puede darle el precio si no utiliz tourist information centre manager.  - Puede imprimir el cupn correspondiente y llevarlo con su receta a la  farmacia.  - Tambin puede pasar por nuestra oficina durante el horario de atencin regular y education officer, museum una tarjeta de cupones de GoodRx.  - Si necesita que su receta se enve electrnicamente a una farmacia diferente, informe a nuestra oficina a travs de MyChart de [place] o por telfono llamando al [phone] y presione la opcin 4.

## 2024-07-20 NOTE — Progress Notes (Signed)
   Follow-Up Visit   Subjective  Pamela Baldwin is a 68 y.o. female who presents for the following: Suture removal  Pathology showed Blue Nevus  Patient also here for cosmetic LN2 to SK at right cheek and ED to Hemangioma at left upper lip.  The following portions of the chart were reviewed this encounter and updated as appropriate: medications, allergies, medical history  Review of Systems:  No other skin or systemic complaints except as noted in HPI or Assessment and Plan.  Objective  Well appearing patient in no apparent distress; mood and affect are within normal limits.  Areas Examined: Gluteal cleft, face  Relevant physical exam findings are noted in the Assessment and Plan.  right cheek Waxy tan patch- see photo   Left Upper Lip Red papule.   Assessment & Plan   SEBORRHEIC KERATOSIS right cheek Reassured benign age-related growth.   Discussed cosmetic procedure cryotherapy, noncovered.  $60 for 1st lesion and $15 for each additional lesion if done on the same day.  Maximum charge $350.  One touch-up treatment included no charge. Discussed risks of treatment including dyspigmentation, small scar, and/or recurrence. Recommend daily broad spectrum sunscreen SPF 30+/photoprotection to treated areas once healed.   Cryotherapy x 1 R cheek Destruction of lesion - right cheek  Destruction method: cryotherapy   Informed consent: discussed and consent obtained   Lesion destroyed using liquid nitrogen: Yes   Region frozen until ice ball extended beyond lesion: Yes   Outcome: patient tolerated procedure well with no complications   Post-procedure details: wound care instructions given   Additional details:  Prior to procedure, discussed risks of blister formation, small wound, skin dyspigmentation, or rare scar following cryotherapy. Recommend Vaseline ointment to treated areas while healing.   Related Medications hydroquinone  4 % cream Apply topically 2 (two) times  daily. Bid to dark spots on face HEMANGIOMA OF SKIN Left Upper Lip Discussed cosmetic procedure ED, noncovered.  $60 for 1st lesion and $15 for each additional lesion if done on the same day.  Maximum charge $350.  One touch-up treatment included no charge. Discussed risks of treatment including dyspigmentation, small scar, and/or recurrence. Recommend daily broad spectrum sunscreen SPF 30+/photoprotection to treated areas once healed.  ED x 1 L upper lip  Destruction of lesion - Left Upper Lip  Destruction method: electrodesiccation and curettage   Destruction method comment:  Electrodesiccation only, not curretage Informed consent: discussed and consent obtained   Patient was prepped and draped in usual sterile fashion: patient was prepped with isopropyl alcohol. Outcome: patient tolerated procedure well with no complications    Encounter for Removal of Sutures - Incision site is clean, dry and intact. - Wound cleansed, sutures removed, wound cleansed and steri strips applied.  - Discussed pathology results showing Blue Nevus, Benign - Patient advised to keep steri-strips dry until they fall off. - Scars remodel for a full year. - Once steri-strips fall off, patient can apply over-the-counter silicone scar cream once to twice a day to help with scar remodeling if desired. - Patient advised to call with any concerns or if they notice any new or changing lesions.   Return as scheduled with Dr Jackquline, for TBSE.  IAndrea Kerns, CMA, am acting as scribe for Rexene Jackquline, MD .   Documentation: I have reviewed the above documentation for accuracy and completeness, and I agree with the above.  Rexene Jackquline, MD

## 2025-07-13 ENCOUNTER — Encounter: Admitting: Dermatology
